# Patient Record
Sex: Male | Born: 1945
Health system: Southern US, Community
[De-identification: ages and names within clinical notes are randomized; demographics above are authoritative.]

## PROBLEM LIST (undated history)

## (undated) DIAGNOSIS — E785 Hyperlipidemia, unspecified: Secondary | ICD-10-CM

## (undated) DIAGNOSIS — I6529 Occlusion and stenosis of unspecified carotid artery: Secondary | ICD-10-CM

## (undated) DIAGNOSIS — I10 Essential (primary) hypertension: Secondary | ICD-10-CM

## (undated) DIAGNOSIS — E119 Type 2 diabetes mellitus without complications: Secondary | ICD-10-CM

## (undated) HISTORY — DX: Hyperlipidemia, unspecified: E78.5

## (undated) HISTORY — DX: Essential (primary) hypertension: I10

## (undated) HISTORY — DX: Occlusion and stenosis of unspecified carotid artery: I65.29

## (undated) HISTORY — DX: Type 2 diabetes mellitus without complications: E11.9

## (undated) HISTORY — PX: CHOLECYSTECTOMY: SHX55

---

## 2011-12-14 ENCOUNTER — Other Ambulatory Visit: Payer: Self-pay

## 2011-12-14 DIAGNOSIS — I739 Peripheral vascular disease, unspecified: Secondary | ICD-10-CM

## 2012-01-08 ENCOUNTER — Encounter: Payer: Self-pay | Admitting: Vascular Surgery

## 2012-01-08 ENCOUNTER — Other Ambulatory Visit: Payer: Self-pay | Admitting: *Deleted

## 2012-01-09 ENCOUNTER — Ambulatory Visit (INDEPENDENT_AMBULATORY_CARE_PROVIDER_SITE_OTHER): Payer: Medicare PPO | Admitting: Vascular Surgery

## 2012-01-09 ENCOUNTER — Encounter (INDEPENDENT_AMBULATORY_CARE_PROVIDER_SITE_OTHER): Payer: Medicare PPO | Admitting: *Deleted

## 2012-01-09 ENCOUNTER — Encounter: Payer: Self-pay | Admitting: Vascular Surgery

## 2012-01-09 VITALS — BP 184/63 | HR 55 | Ht 66.0 in | Wt 207.0 lb

## 2012-01-09 DIAGNOSIS — I6529 Occlusion and stenosis of unspecified carotid artery: Secondary | ICD-10-CM

## 2012-01-09 DIAGNOSIS — I739 Peripheral vascular disease, unspecified: Secondary | ICD-10-CM

## 2012-01-09 NOTE — Addendum Note (Signed)
Addended by: Dannielle Karvonen on: 01/09/2012 03:39 PM   Modules accepted: Orders

## 2012-01-09 NOTE — Assessment & Plan Note (Signed)
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 He has a palpable posterior tibial pulse on the right side not convinced that all of his symptoms can be attributed to peripheral vascular disease. He does have back pain and some of his pain may be neurogenic. 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 I have discussed with the patient the nature of atherosclerosis, and emphasized the importance of maximal medical management including control of blood pressure, blood glucose, and cholesterol levels, antiplatelet agents, obtaining regular exercise, and cessation of smoking. The patient is aware that without maximal medical management the underlying atherosclerotic disease process will progress and also limit the benefit of any interventions. I've explained that we would normally not consider revascularization unless he developed rest pain or nonhealing ulcer. I have ordered follow up ABIs for 6 months and I'll see him back at that time. If his symptoms progress on the left side, we could consider arteriography to further evaluate her for possible bypass. He knows to call sooner if he has problems or progression of his symptoms. Otherwise I'll see him in 6 months.

## 2012-01-09 NOTE — Progress Notes (Signed)
[facility]  Patient name: Barry Shepherd MRN: [id number] DOB: Mar 23, 1946 Sex: male  REASON FOR CONSULT: carotid disease and peripheral vascular disease  HPI: [name] is a 66 y.o. male a routine carotid duplex scan given his history of hypertension and family history of severe carotid disease. This showed bilateral 50-69% carotid stenoses. In addition he was complaining of leg pain and was sent for vascular evaluation. The patient states he's had history of pain in both legs for many years. He describes this as burning and stinging pain in his hips and legs bilaterally. His symptoms are more significant on the left side. He does describe some calf claudication with ambulation it is relieved with rest it is again more significant on the left side. He denies any history of rest pain or nonhealing ulcers.  He denies any history of stroke, TIAs, expressive or receptive aphasia, or amaurosis fugax. Of note he is left-handed.  He is a heavy smoker. He does not take aspirin to  Past Medical History  Diagnosis Date  . Diabetes mellitus without complication   . Carotid artery occlusion   . Hypertension   . Hyperlipidemia     Family History  Problem Relation Age of Onset  . Heart disease Father     before age 10  . Hyperlipidemia Father   . Hypertension Father   . Heart attack Father     SOCIAL HISTORY: History  Substance Use Topics  . Smoking status: Current Every Day Smoker -- 1.5 packs/day for 50 years    Types: Cigarettes  . Smokeless tobacco: Never Used  . Alcohol Use: No    No Known Allergies  Current Outpatient Prescriptions  Medication Sig Dispense Refill  . glipiZIDE (GLUCOTROL) 10 MG tablet Take 10 mg by mouth 2 (two) times daily before a meal.      . lisinopril (PRINIVIL,ZESTRIL) 40 MG tablet Take 40 mg by mouth daily.      . meloxicam (MOBIC) 15 MG tablet Take 15 mg by mouth daily.      . metFORMIN (GLUCOPHAGE) 1000 MG tablet Take  1,000 mg by mouth 2 (two) times daily with a meal.      . metoprolol succinate (TOPROL-XL) 25 MG 24 hr tablet Take 25 mg by mouth daily.      . Omega-3 Fatty Acids (FISH OIL) 1000 MG CAPS Take by mouth.      . Tamsulosin HCl (FLOMAX) 0.4 MG CAPS Take 0.4 mg by mouth daily.        REVIEW OF SYSTEMS: [name] ] denotes positive finding; [  ] denotes negative finding  CARDIOVASCULAR:  [ ]  chest pain   [ ]  chest pressure   [ ]  palpitations   [name] ] orthopnea   [name] ] dyspnea on exertion   [name] ] claudication   [ ]  rest pain   [ ]  DVT   [ ]  phlebitis PULMONARY:   [ ]  productive cough   [name] ] asthma   [ ]  wheezing NEUROLOGIC:   [ ]  weakness  [name] ] paresthesias  [ ]  aphasia  [ ]  amaurosis  [ ]  dizziness HEMATOLOGIC:   [ ]  bleeding problems   [ ]  clotting disorders MUSCULOSKELETAL:  [ ]  joint pain   [ ]  joint swelling [ ]  leg swelling GASTROINTESTINAL: [ ]   blood in stool  [ ]   hematemesis GENITOURINARY:  [name] ]  dysuria  [ ]   hematuria PSYCHIATRIC:  [ ]  history of major depression INTEGUMENTARY:  [ ]   rashes  [ ]  ulcers CONSTITUTIONAL:  [ ]  fever   [ ]  chills  PHYSICAL EXAM: Filed Vitals:   01/09/12 1105 01/09/12 1109  BP: 197/57 184/63  Pulse: 55   Height: 5\' 6"  (1.676 m)   Weight: 207 lb (93.895 kg)   SpO2: 99%    Body mass index is 33.41 kg/(m^2). GENERAL: The patient is a well-nourished male, in no acute distress. The vital signs are documented above. CARDIOVASCULAR: There is a regular rate and rhythm. I do not detect carotid bruits. He has palpable femoral pulses. On the right side, he has a palpable popliteal and posterior tibial pulse. On the left side, I cannot palpate a popliteal or pedal pulses. The left foot is warm. He has mild bilateral lower extremity swelling. PULMONARY: There is good air exchange bilaterally without wheezing or rales. ABDOMEN: Soft and non-tender with normal pitched bowel sounds. Because of his obesity, it is difficult to assess for an aneurysm. MUSCULOSKELETAL: There are no  major deformities or cyanosis. NEUROLOGIC: No focal weakness or paresthesias are detected. SKIN: There are no ulcers or rashes noted. PSYCHIATRIC: The patient has a normal affect.  DATA:  I have reviewed his carotid duplex scan which was done at the outlying hospital. He has bilateral 50-69% carotid stenoses.  I have reviewed his office records from Dr. Jinny Sanders office. He has type 2 diabetes and his hemoglobin A1c has improved from 9.6-8.2. His blood pressure has not been well controlled. His LDL was elevated on last follow up visit at 132.  I have independently interpreted her duplex scan of his legs in our office which does show some mild infrainguinal arterial occlusive disease on the right with a short segment occlusion of the left superficial femoral artery appeared  MEDICAL ISSUES:  Occlusion and stenosis of carotid artery without mention of cerebral infarction This patient has bilateral 50-69% carotid stenoses which are asymptomatic. I've explained we would not consider carotid endarterectomy unless the stenosis progressed to greater than 80% or he developed new neurologic symptoms. I have encouraged him to begin taking a baby aspirin daily as this will lower his risk of heart attack and stroke. I have ordered a follow carotid duplex scan in 6 months and I'll see him back at this time. He knows to call sooner if he has problems. We have also discussed the importance of tobacco cessation.   Peripheral vascular disease, unspecified Based on his exam and duplex study he does have evidence of infrainguinal arterial occlusive disease on the left and mild disease on the right side. He has a palpable posterior tibial pulse on the right side not convinced that all of his symptoms can be attributed to peripheral vascular disease. He does have back pain and some of his pain may be neurogenic. He does have significant infrainguinal arterial occlusive disease on the left and I have discussed with him  the importance of tobacco cessation and a structured walking program. I have discussed with the patient the nature of atherosclerosis, and emphasized the importance of maximal medical management including control of blood pressure, blood glucose, and cholesterol levels, antiplatelet agents, obtaining regular exercise, and cessation of smoking. The patient is aware that without maximal medical management the underlying atherosclerotic disease process will progress and also limit the benefit of any interventions. I've explained that we would normally not consider revascularization unless he developed rest pain or nonhealing ulcer. I have ordered follow up ABIs for 6 months and I'll see him back at that time.  If his symptoms progress on the left side, we could consider arteriography to further evaluate her for possible bypass. He knows to call sooner if he has problems or progression of his symptoms. Otherwise I'll see him in 6 months.     DICKSON,CHRISTOPHER S [facility]: [phone]

## 2012-01-09 NOTE — Assessment & Plan Note (Signed)
This patient has bilateral 50-69% carotid stenoses which are asymptomatic. I've explained we would not consider carotid endarterectomy unless the stenosis progressed to greater than 80% or he developed new neurologic symptoms. I have encouraged him to begin taking a baby aspirin daily as this will lower his risk of heart attack and stroke. I have ordered a follow carotid duplex scan in 6 months and I'll see him back at this time. He knows to call sooner if he has problems. We have also discussed the importance of tobacco cessation.

## 2012-07-08 ENCOUNTER — Encounter: Payer: Self-pay | Admitting: Vascular Surgery

## 2012-07-09 ENCOUNTER — Ambulatory Visit (INDEPENDENT_AMBULATORY_CARE_PROVIDER_SITE_OTHER): Payer: Medicare PPO | Admitting: Vascular Surgery

## 2012-07-09 ENCOUNTER — Encounter (INDEPENDENT_AMBULATORY_CARE_PROVIDER_SITE_OTHER): Payer: Medicare PPO | Admitting: *Deleted

## 2012-07-09 ENCOUNTER — Encounter: Payer: Self-pay | Admitting: Vascular Surgery

## 2012-07-09 ENCOUNTER — Other Ambulatory Visit (INDEPENDENT_AMBULATORY_CARE_PROVIDER_SITE_OTHER): Payer: Medicare PPO | Admitting: *Deleted

## 2012-07-09 DIAGNOSIS — I739 Peripheral vascular disease, unspecified: Secondary | ICD-10-CM

## 2012-07-09 DIAGNOSIS — I6529 Occlusion and stenosis of unspecified carotid artery: Secondary | ICD-10-CM

## 2012-07-09 NOTE — Assessment & Plan Note (Signed)
He has stable claudication of the left lower extremity. I've encouraged him to get off of the tobacco and get on a structured walking program. I would only consider arteriography if he developed rest pain or nonhealing ulcer unless his symptoms became disabling.I have also discussed with the patient the nature of atherosclerosis, and emphasized the importance of maximal medical management including control of blood pressure, blood glucose, and cholesterol levels, antiplatelet agents, obtaining regular exercise, and cessation of smoking. The patient is aware that without maximal medical management the underlying atherosclerotic disease process will progress and also limit the benefit of any interventions. He states that he was unable to take aspirin because he bled easily. He also states that he forgets to take his statin. Have ordered follow up ABIs and I'll see him back in one year. He knows to call sooner if he has problems.

## 2012-07-09 NOTE — Addendum Note (Signed)
Addended by: Adria Dill L on: 07/09/2012 04:03 PM   Modules accepted: Orders

## 2012-07-09 NOTE — Progress Notes (Signed)
[facility] and Vein Specialist of [facility]  Patient name: Barry Shepherd MRN: [id number] DOB: [birth year] Sex: male  REASON FOR VISIT: all of peripheral vascular disease and carotid disease.  HPI: Barry Shepherd is a 67 y.o. male comes in for a 6 month follow up visit. He has known bilateral carotid disease and also peripheral vascular disease.  With respect to his carotid disease. He denies any history of stroke, TIAs, expressive or receptive aphasia, or amaurosis fugax.  He has stable left lower extremity claudication which involves his thigh and calf on the left. He has no significant symptoms on the right. He denies rest pain or history of nonhealing ulcers.  Past Medical History  Diagnosis Date  . Diabetes mellitus without complication   . Carotid artery occlusion   . Hypertension   . Hyperlipidemia     Family History  Problem Relation Age of Onset  . Heart disease Father     before age 76  . Hyperlipidemia Father   . Hypertension Father   . Heart attack Father     SOCIAL HISTORY: History  Substance Use Topics  . Smoking status: Current Every Day Smoker -- 1.50 packs/day for 50 years    Types: Cigarettes  . Smokeless tobacco: Never Used  . Alcohol Use: No    No Known Allergies  Current Outpatient Prescriptions  Medication Sig Dispense Refill  . glipiZIDE (GLUCOTROL) 10 MG tablet Take 10 mg by mouth 2 (two) times daily before a meal.      . lisinopril (PRINIVIL,ZESTRIL) 40 MG tablet Take 40 mg by mouth daily.      . meloxicam (MOBIC) 15 MG tablet Take 15 mg by mouth daily.      . metFORMIN (GLUCOPHAGE) 1000 MG tablet Take 1,000 mg by mouth 2 (two) times daily with a meal.      . metoprolol succinate (TOPROL-XL) 25 MG 24 hr tablet Take 25 mg by mouth daily.      . Omega-3 Fatty Acids (FISH OIL) 1000 MG CAPS Take by mouth.      . Tamsulosin HCl (FLOMAX) 0.4 MG CAPS Take 0.4 mg by mouth daily.       No current facility-administered medications for this visit.     REVIEW OF SYSTEMS: [name] ] denotes positive finding; [  ] denotes negative finding  CARDIOVASCULAR:  [ ]  chest pain   [ ]  chest pressure   [ ]  palpitations   [ ]  orthopnea   [name] ] dyspnea on exertion   [ ]  claudication   [ ]  rest pain   [ ]  DVT   [ ]  phlebitis PULMONARY:   [ ]  productive cough   [ ]  asthma   [ ]  wheezing NEUROLOGIC:   [ ]  weakness  [ ]  paresthesias  [ ]  aphasia  [ ]  amaurosis  [ ]  dizziness HEMATOLOGIC:   [name] ] bleeding problems   [ ]  clotting disorders MUSCULOSKELETAL:  [ ]  joint pain   [ ]  joint swelling [ ]  leg swelling GASTROINTESTINAL: [ ]   blood in stool  [ ]   hematemesis GENITOURINARY:  [ ]   dysuria  [ ]   hematuria PSYCHIATRIC:  [ ]  history of major depression INTEGUMENTARY:  [ ]  rashes  [ ]  ulcers CONSTITUTIONAL:  [ ]  fever   [ ]  chills  PHYSICAL EXAM: Filed Vitals:   07/09/12 1011 07/09/12 1015  BP: 147/64 151/68  Pulse: 69 70  Resp: 16   Height: 5\' 6"  (1.676 m)   Weight: 201 lb (  91.173 kg)   SpO2: 98%    Body mass index is 32.46 kg/(m^2). GENERAL: The patient is a well-nourished male, in no acute distress. The vital signs are documented above. CARDIOVASCULAR: There is a regular rate and rhythm. He has a right carotid bruit. He has palpable femoral pulses although the left femoral pulse maybe slightly diminished. I cannot palpate popliteal pulses bilaterally. He does have a weakly palpable right posterior tibial pulse. I cannot palpate pedal pulses on the left. PULMONARY: There is good air exchange bilaterally without wheezing or rales. ABDOMEN: Soft and non-tender with normal pitched bowel sounds. His abdomen is obese and difficult to assess for an aneurysm. MUSCULOSKELETAL: There are no major deformities or cyanosis. NEUROLOGIC: No focal weakness or paresthesias are detected. SKIN: There are no ulcers or rashes noted. PSYCHIATRIC: The patient has a normal affect.  DATA:  I have independently interpreted his carotid duplex scan which shows bilateral  40-59% carotid stenoses.  I have independently interpreted his arterial Doppler study which is biphasic Doppler signals in the right posterior tibial and dorsalis pedis positions with an ABI of 100%. On the left side he has a monophasic posterior tibial signal and a monophasic dorsalis pedis signal. ABI on the left is 83%. He is actually improved slightly compared to the study 6 months ago.  MEDICAL ISSUES:  Peripheral vascular disease, unspecified He has stable claudication of the left lower extremity. I've encouraged him to get off of the tobacco and get on a structured walking program. I would only consider arteriography if he developed rest pain or nonhealing ulcer unless his symptoms became disabling.I have also discussed with the patient the nature of atherosclerosis, and emphasized the importance of maximal medical management including control of blood pressure, blood glucose, and cholesterol levels, antiplatelet agents, obtaining regular exercise, and cessation of smoking. The patient is aware that without maximal medical management the underlying atherosclerotic disease process will progress and also limit the benefit of any interventions. He states that he was unable to take aspirin because he bled easily. He also states that he forgets to take his statin. Have ordered follow up ABIs and I'll see him back in one year. He knows to call sooner if he has problems.   Occlusion and stenosis of carotid artery without mention of cerebral infarction He has bilateral asymptomatic 40-59% carotid stenoses. He understands we would not consider carotid endarterectomy was the stenosis progressed to greater than 80%. Discussed the importance of tobacco cessation and also I've encouraged him to get on an aspirin daily. He does not take [place] this he states he bleeds easily. I've asked him to consider at least 81 mg a day or even every other day. I've ordered a follow carotid duplex scan in 1 year and I'll see him  back at that time. He knows to call sooner if he has problems.   Mollie Rossano S [facility] [place]: [phone]

## 2012-07-09 NOTE — Assessment & Plan Note (Signed)
He has bilateral asymptomatic 40-59% carotid stenoses. He understands we would not consider carotid endarterectomy was the stenosis progressed to greater than 80%. Discussed the importance of tobacco cessation and also I've encouraged him to get on an aspirin daily. He does not take [place] this he states he bleeds easily. I've asked him to consider at least 81 mg a day or even every other day. I've ordered a follow carotid duplex scan in 1 year and I'll see him back at that time. He knows to call sooner if he has problems.

## 2013-04-29 ENCOUNTER — Other Ambulatory Visit: Payer: Self-pay | Admitting: Vascular Surgery

## 2013-04-29 DIAGNOSIS — I6529 Occlusion and stenosis of unspecified carotid artery: Secondary | ICD-10-CM

## 2013-04-29 DIAGNOSIS — I739 Peripheral vascular disease, unspecified: Secondary | ICD-10-CM

## 2013-07-08 ENCOUNTER — Other Ambulatory Visit (HOSPITAL_COMMUNITY): Payer: Medicare PPO

## 2013-07-08 ENCOUNTER — Ambulatory Visit: Payer: Medicare PPO | Admitting: Vascular Surgery

## 2013-07-14 ENCOUNTER — Encounter: Payer: Self-pay | Admitting: Family

## 2013-07-15 ENCOUNTER — Encounter: Payer: Self-pay | Admitting: Family

## 2013-07-15 ENCOUNTER — Ambulatory Visit (HOSPITAL_COMMUNITY)
Admission: RE | Admit: 2013-07-15 | Discharge: 2013-07-15 | Disposition: A | Discharge status: Home / Self Care | Payer: Medicare PPO | Source: Ambulatory Visit | Attending: Family | Admitting: Family

## 2013-07-15 ENCOUNTER — Ambulatory Visit (INDEPENDENT_AMBULATORY_CARE_PROVIDER_SITE_OTHER): Payer: Medicare PPO | Admitting: Family

## 2013-07-15 VITALS — BP 172/75 | HR 70 | Resp 16 | Ht 65.0 in | Wt 204.0 lb

## 2013-07-15 DIAGNOSIS — I739 Peripheral vascular disease, unspecified: Secondary | ICD-10-CM

## 2013-07-15 DIAGNOSIS — I6529 Occlusion and stenosis of unspecified carotid artery: Secondary | ICD-10-CM

## 2013-07-15 NOTE — Patient Instructions (Signed)
Stroke Prevention Some medical conditions and behaviors are associated with an increased chance of having a stroke. You may prevent a stroke by making healthy choices and managing medical conditions. HOW CAN I REDUCE MY RISK OF HAVING A STROKE?   Stay physically active. Get at least 30 minutes of activity on most or all days.  Do not smoke. It may also be helpful to avoid exposure to secondhand smoke.  Limit alcohol use. Moderate alcohol use is considered to be:  No more than 2 drinks per day for men.  No more than 1 drink per day for nonpregnant women.  Eat healthy foods. This involves  Eating 5 or more servings of fruits and vegetables a day.  Following a diet that addresses high blood pressure (hypertension), high cholesterol, diabetes, or obesity.  Manage your cholesterol levels.  A diet low in saturated fat, trans fat, and cholesterol and high in fiber may control cholesterol levels.  Take any prescribed medicines to control cholesterol as directed by your health care provider.  Manage your diabetes.  A controlled-carbohydrate, controlled-sugar diet is recommended to manage diabetes.  Take any prescribed medicines to control diabetes as directed by your health care provider.  Control your hypertension.  A low-salt (sodium), low-saturated fat, low-trans fat, and low-cholesterol diet is recommended to manage hypertension.  Take any prescribed medicines to control hypertension as directed by your health care provider.  Maintain a healthy weight.  A reduced-calorie, low-sodium, low-saturated fat, low-trans fat, low-cholesterol diet is recommended to manage weight.  Stop drug abuse.  Avoid taking birth control pills.  Talk to your health care provider about the risks of taking birth control pills if you are over 35 years old, smoke, get migraines, or have ever had a blood clot.  Get evaluated for sleep disorders (sleep apnea).  Talk to your health care provider about  getting a sleep evaluation if you snore a lot or have excessive sleepiness.  Take medicines as directed by your health care provider.  For some people, aspirin or blood thinners (anticoagulants) are helpful in reducing the risk of forming abnormal blood clots that can lead to stroke. If you have the irregular heart rhythm of atrial fibrillation, you should be on a blood thinner unless there is a good reason you cannot take them.  Understand all your medicine instructions.  Make sure that other other conditions (such as anemia or atherosclerosis) are addressed. SEEK IMMEDIATE MEDICAL CARE IF:   You have sudden weakness or numbness of the face, arm, or leg, especially on one side of the body.  Your face or eyelid droops to one side.  You have sudden confusion.  You have trouble speaking (aphasia) or understanding.  You have sudden trouble seeing in one or both eyes.  You have sudden trouble walking.  You have dizziness.  You have a loss of balance or coordination.  You have a sudden, severe headache with no known cause.  You have new chest pain or an irregular heartbeat. Any of these symptoms may represent a serious problem that is an emergency. Do not wait to see if the symptoms will go away. Get medical help at once. Call your local emergency services  (911 in [place] Do not drive yourself to the hospital. Document Released: 04/19/2004 Document Revised: 12/31/2012 Document Reviewed: 09/12/2012 [employer] Patient Information 2014 [employer].   Peripheral Vascular Disease Peripheral Vascular Disease (PVD), also called Peripheral Arterial Disease (PAD), is a circulation problem caused by cholesterol (atherosclerotic plaque) deposits in the   arteries. PVD commonly occurs in the lower extremities (legs) but it can occur in other areas of the body, such as your arms. The cholesterol buildup in the arteries reduces blood flow which can cause pain and other serious problems. The presence  of PVD can place a person at risk for Coronary Artery Disease (CAD).  CAUSES  Causes of PVD can be many. It is usually associated with more than one risk factor such as:   High Cholesterol.  Smoking.  Diabetes.  Lack of exercise or inactivity.  High blood pressure (hypertension).  Obesity.  Family history. SYMPTOMS   When the lower extremities are affected, patients with PVD may experience:  Leg pain with exertion or physical activity. This is called INTERMITTENT CLAUDICATION. This may present as cramping or numbness with physical activity. The location of the pain is associated with the level of blockage. For example, blockage at the abdominal level (distal abdominal aorta) may result in buttock or hip pain. Lower leg arterial blockage may result in calf pain.  As PVD becomes more severe, pain can develop with less physical activity.  In people with severe PVD, leg pain may occur at rest.  Other PVD signs and symptoms:  Leg numbness or weakness.  Coldness in the affected leg or foot, especially when compared to the other leg.  A change in leg color.  Patients with significant PVD are more prone to ulcers or sores on toes, feet or legs. These may take longer to heal or may reoccur. The ulcers or sores can become infected.  If signs and symptoms of PVD are ignored, gangrene may occur. This can result in the loss of toes or loss of an entire limb.  Not all leg pain is related to PVD. Other medical conditions can cause leg pain such as:  Blood clots (embolism) or Deep Vein Thrombosis.  Inflammation of the blood vessels (vasculitis).  Spinal stenosis. DIAGNOSIS  Diagnosis of PVD can involve several different types of tests. These can include:  Pulse Volume Recording Method (PVR). This test is simple, painless and does not involve the use of X-rays. PVR involves measuring and comparing the blood pressure in the arms and legs. An ABI (Ankle-Brachial Index) is calculated.  The normal ratio of blood pressures is 1. As this number becomes smaller, it indicates more severe disease.  < 0.95  indicates significant narrowing in one or more leg vessels.  <0.8 there will usually be pain in the foot, leg or buttock with exercise.  <0.4 will usually have pain in the legs at rest.  <0.25  usually indicates limb threatening PVD.  Doppler detection of pulses in the legs. This test is painless and checks to see if you have a pulses in your legs/feet.  A dye or contrast material (a substance that highlights the blood vessels so they show up on x-ray) may be given to help your caregiver better see the arteries for the following tests. The dye is eliminated from your body by the kidney's. Your caregiver may order blood work to check your kidney function and other laboratory values before the following tests are performed:  Magnetic Resonance Angiography (MRA). An MRA is a picture study of the blood vessels and arteries. The MRA machine uses a large magnet to produce images of the blood vessels.  Computed Tomography Angiography (CTA). A CTA is a specialized x-ray that looks at how the blood flows in your blood vessels. An IV may be inserted into your arm so contrast dye   can be injected.  Angiogram. Is a procedure that uses x-rays to look at your blood vessels. This procedure is minimally invasive, meaning a small incision (cut) is made in your groin. A small tube (catheter) is then inserted into the artery of your groin. The catheter is guided to the blood vessel or artery your caregiver wants to examine. Contrast dye is injected into the catheter. X-rays are then taken of the blood vessel or artery. After the images are obtained, the catheter is taken out. TREATMENT  Treatment of PVD involves many interventions which may include:  Lifestyle changes:  Quitting smoking.  Exercise.  Following a low fat, low cholesterol diet.  Control of diabetes.  Foot care is very  important to the PVD patient. Good foot care can help prevent infection.  Medication:  Cholesterol-lowering medicine.  Blood pressure medicine.  Anti-platelet drugs.  Certain medicines may reduce symptoms of Intermittent Claudication.  Interventional/Surgical options:  Angioplasty. An Angioplasty is a procedure that inflates a balloon in the blocked artery. This opens the blocked artery to improve blood flow.  Stent Implant. A wire mesh tube (stent) is placed in the artery. The stent expands and stays in place, allowing the artery to remain open.  Peripheral Bypass Surgery. This is a surgical procedure that reroutes the blood around a blocked artery to help improve blood flow. This type of procedure may be performed if Angioplasty or stent implants are not an option. SEEK IMMEDIATE MEDICAL CARE IF:   You develop pain or numbness in your arms or legs.  Your arm or leg turns cold, becomes blue in color.  You develop redness, warmth, swelling and pain in your arms or legs. MAKE SURE YOU:   Understand these instructions.  Will watch your condition.  Will get help right away if you are not doing well or get worse. Document Released: 04/19/2004 Document Revised: 06/04/2011 Document Reviewed: 03/16/2008 Exit[employer] Patient Information 2014 [employer].   Smoking Cessation Quitting smoking is important to your health and has many advantages. However, it is not always easy to quit since nicotine is a very addictive drug. Often times, people try 3 times or more before being able to quit. This document explains the best ways for you to prepare to quit smoking. Quitting takes hard work and a lot of effort, but you can do it. ADVANTAGES OF QUITTING SMOKING  You will live longer, feel better, and live better.  Your body will feel the impact of quitting smoking almost immediately.  Within 20 minutes, blood pressure decreases. Your pulse returns to its normal level.  After 8 hours,  carbon monoxide levels in the blood return to normal. Your oxygen level increases.  After 24 hours, the chance of having a heart attack starts to decrease. Your breath, hair, and body stop smelling like smoke.  After 48 hours, damaged nerve endings begin to recover. Your sense of taste and smell improve.  After 72 hours, the body is virtually free of nicotine. Your bronchial tubes relax and breathing becomes easier.  After 2 to 12 weeks, lungs can hold more air. Exercise becomes easier and circulation improves.  The risk of having a heart attack, stroke, cancer, or lung disease is greatly reduced.  After 1 year, the risk of coronary heart disease is cut in half.  After 5 years, the risk of stroke falls to the same as a nonsmoker.  After 10 years, the risk of lung cancer is cut in half and the risk of other cancers   decreases significantly.  After 15 years, the risk of coronary heart disease drops, usually to the level of a nonsmoker.  If you are pregnant, quitting smoking will improve your chances of having a healthy baby.  The people you live with, especially any children, will be healthier.  You will have extra money to spend on things other than cigarettes. QUESTIONS TO THINK ABOUT BEFORE ATTEMPTING TO QUIT You may want to talk about your answers with your caregiver.  Why do you want to quit?  If you tried to quit in the past, what helped and what did not?  What will be the most difficult situations for you after you quit? How will you plan to handle them?  Who can help you through the tough times? Your family? Friends? A caregiver?  What pleasures do you get from smoking? What ways can you still get pleasure if you quit? Here are some questions to ask your caregiver:  How can you help me to be successful at quitting?  What medicine do you think would be best for me and how should I take it?  What should I do if I need more help?  What is smoking withdrawal like? How  can I get information on withdrawal? GET READY  Set a quit date.  Change your environment by getting rid of all cigarettes, ashtrays, matches, and lighters in your home, car, or work. Do not let people smoke in your home.  Review your past attempts to quit. Think about what worked and what did not. GET SUPPORT AND ENCOURAGEMENT You have a better chance of being successful if you have help. You can get support in many ways.  Tell your family, friends, and co-workers that you are going to quit and need their support. Ask them not to smoke around you.  Get individual, group, or telephone counseling and support. Programs are available at local hospitals and health centers. Call your local health department for information about programs in your area.  Spiritual beliefs and practices may help some smokers quit.  Download a "quit meter" on your computer to keep track of quit statistics, such as how long you have gone without smoking, cigarettes not smoked, and money saved.  Get a self-help book about quitting smoking and staying off of tobacco. LEARN NEW SKILLS AND BEHAVIORS  Distract yourself from urges to smoke. Talk to someone, go for a walk, or occupy your time with a task.  Change your normal routine. Take a different route to work. Drink tea instead of coffee. Eat breakfast in a different place.  Reduce your stress. Take a hot bath, exercise, or read a book.  Plan something enjoyable to do every day. Reward yourself for not smoking.  Explore interactive web-based programs that specialize in helping you quit. GET MEDICINE AND USE IT CORRECTLY Medicines can help you stop smoking and decrease the urge to smoke. Combining medicine with the above behavioral methods and support can greatly increase your chances of successfully quitting smoking.  Nicotine replacement therapy helps deliver nicotine to your body without the negative effects and risks of smoking. Nicotine replacement therapy  includes nicotine gum, lozenges, inhalers, nasal sprays, and skin patches. Some may be available over-the-counter and others require a prescription.  Antidepressant medicine helps people abstain from smoking, but how this works is unknown. This medicine is available by prescription.  Nicotinic receptor partial agonist medicine simulates the effect of nicotine in your brain. This medicine is available by prescription. Ask your caregiver for   advice about which medicines to use and how to use them based on your health history. Your caregiver will tell you what side effects to look out for if you choose to be on a medicine or therapy. Carefully read the information on the package. Do not use any other product containing nicotine while using a nicotine replacement product.  RELAPSE OR DIFFICULT SITUATIONS Most relapses occur within the first 3 months after quitting. Do not be discouraged if you start smoking again. Remember, most people try several times before finally quitting. You may have symptoms of withdrawal because your body is used to nicotine. You may crave cigarettes, be irritable, feel very hungry, cough often, get headaches, or have difficulty concentrating. The withdrawal symptoms are only temporary. They are strongest when you first quit, but they will go away within 10 14 days. To reduce the chances of relapse, try to:  Avoid drinking alcohol. Drinking lowers your chances of successfully quitting.  Reduce the amount of caffeine you consume. Once you quit smoking, the amount of caffeine in your body increases and can give you symptoms, such as a rapid heartbeat, sweating, and anxiety.  Avoid smokers because they can make you want to smoke.  Do not let weight gain distract you. Many smokers will gain weight when they quit, usually less than 10 pounds. Eat a healthy diet and stay active. You can always lose the weight gained after you quit.  Find ways to improve your mood other than  smoking. FOR MORE INFORMATION  www.smokefree.gov  Document Released: 03/06/2001 Document Revised: 09/11/2011 Document Reviewed: 06/21/2011 [employer] Patient Information 2014 [employer].  

## 2013-07-15 NOTE — Addendum Note (Signed)
Addended by: Sharee PimpleMCCHESNEY, MARILYN K on: 07/15/2013 06:21 PM   Modules accepted: Orders

## 2013-07-15 NOTE — Progress Notes (Signed)
Established Carotid Patient   History of Present Illness  Barry HaberBilly Shore is a 68 y.o. male patient of Dr. Edilia Boickson who returns today for carotid artery surveillance. He also has known PAD. He has known bilateral carotid disease and also peripheral vascular disease.  With respect to his carotid disease. He denies any history of stroke, TIAs, expressive or receptive aphasia, or amaurosis fugax.  He has stable left lower extremity claudication which involves his thigh and calf on the left. He has no significant symptoms on the right. He denies rest pain or history of nonhealing ulcers. He reports pain in the buttocks after walking about 100-200 feet, relieved with rest, states this is improving over time, relieved with rest; but he states that he has been diagnosed with bilateral hip arthritis and lumbar spine issues.  Patient has not had previous carotid artery intervention.  Patient has Negative history of TIA or stroke symptom.  The patient denies amaurosis fugax or monocular blindness.  The patient  denies facial drooping.  Pt. denies hemiplegia.  The patient denies receptive or expressive aphasia.     Patient denies New Medical or Surgical History.  Pt Diabetic: Yes Pt smoker: smoker  (1.5 ppd, started at age 68 yrs)  Pt meds include: Statin : No ASA: Yes Other anticoagulants/antiplatelets: no   Past Medical History  Diagnosis Date  . Diabetes mellitus without complication   . Carotid artery occlusion   . Hypertension   . Hyperlipidemia     Social History History  Substance Use Topics  . Smoking status: Current Every Day Smoker -- 1.50 packs/day for 50 years    Types: Cigarettes  . Smokeless tobacco: Never Used  . Alcohol Use: No    Family History Family History  Problem Relation Age of Onset  . Heart disease Father     before age 68  . Hyperlipidemia Father   . Hypertension Father   . Heart attack Father   . Heart disease Mother   . Hypertension Mother      Surgical History Past Surgical History  Procedure Laterality Date  . Cholecystectomy      No Known Allergies  Current Outpatient Prescriptions  Medication Sig Dispense Refill  . ACCU-CHEK AVIVA PLUS test strip       . ACCU-CHEK SOFTCLIX LANCETS lancets       . glipiZIDE (GLUCOTROL) 10 MG tablet Take 10 mg by mouth 2 (two) times daily before a meal.      . lisinopril (PRINIVIL,ZESTRIL) 40 MG tablet Take 40 mg by mouth daily.      . meloxicam (MOBIC) 15 MG tablet Take 15 mg by mouth daily.      . metFORMIN (GLUCOPHAGE) 1000 MG tablet Take 1,000 mg by mouth 2 (two) times daily with a meal.      . metoprolol succinate (TOPROL-XL) 25 MG 24 hr tablet Take 25 mg by mouth daily.      . Omega-3 Fatty Acids (FISH OIL) 1000 MG CAPS Take by mouth.      . Tamsulosin HCl (FLOMAX) 0.4 MG CAPS Take 0.4 mg by mouth daily.       No current facility-administered medications for this visit.    Review of Systems : See HPI for pertinent positives and negatives.  Physical Examination  Filed Vitals:   07/15/13 1533  BP: 172/75  Pulse: 70  Resp: 16   Filed Weights   07/15/13 1533  Weight: 204 lb (92.534 kg)   Body mass index is 33.95 kg/(m^2).  General: WDWN male in NAD GAIT: normal Eyes: PERRLA Pulmonary:  Non-labored, CTAB, Negative  Rales, Negative rhonchi, & Negative wheezing.  Cardiac: regular Rhythm ,  Negative detected murmur.  VASCULAR EXAM Carotid Bruits Left Right   Negative Positive    Aorta is not palpable. Radial pulses are 2+ palpable and equal.                                                                                                                            LE Pulses LEFT RIGHT       FEMORAL  1+ palpable  2+ palpable        POPLITEAL  not palpable   not palpable       POSTERIOR TIBIAL  not palpable    palpable        DORSALIS PEDIS      ANTERIOR TIBIAL faintly palpable   palpable     Gastrointestinal: soft, nontender, BS WNL, no r/g,  negative  masses.  Musculoskeletal: Negative muscle atrophy/wasting. M/S 5/5 throughout, Extremities without ischemic changes  Neurologic: A&O X 3; Appropriate Affect ; SENSATION ;normal;  Speech is normal CN 2-12 intact, Pain and light touch intact in extremities, Motor exam as listed above.   Non-Invasive Vascular Imaging CAROTID DUPLEX 07/15/2013   CEREBROVASCULAR DUPLEX EVALUATION    INDICATION: Carotid disease    PREVIOUS INTERVENTION(S):     DUPLEX EXAM:     RIGHT  LEFT  Peak Systolic Velocities (cm/s) End Diastolic Velocities (cm/s) Plaque LOCATION Peak Systolic Velocities (cm/s) End Diastolic Velocities (cm/s) Plaque  128 20  CCA PROXIMAL 112 14   90 16  CCA MID 117 19   78 17  CCA DISTAL 86 14 CP  171 13 HT ECA 117 13   265 71 HT ICA PROXIMAL 223 68 CP  83 14  ICA MID 133 68   60 20  ICA DISTAL 107 21     3.4 ICA / CCA Ratio (PSV) 2.6  Antegrade Vertebral Flow Antegrade   Brachial Systolic Pressure (mmHg)   Multiphasic (subclavian artery) Brachial Artery Waveforms Multiphasic (subclavian artery)    Plaque Morphology:  HM = Homogeneous, HT = Heterogeneous, CP = Calcific Plaque, SP = Smooth Plaque, IP = Irregular Plaque     ADDITIONAL FINDINGS: No significant stenosis of the bilateral external or common carotid arteries.    IMPRESSION: Doppler velocities suggest a 60-79% stenosis of the right proximal-mid internal carotid artery and a 60-79% stenosis of the left proximal internal carotid artery.    Compared to the previous exam:  Significant progression of disease noted in the bilateral internal carotid arteries when compared to the previous exam on 07/09/12.      Assessment: Barry Shepherd is a 68 y.o. 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 The bilateral  ICA stenosis is  Worse from previous exam.  Plan:  He was counseled re smoking cessation. Follow-up in 6 months with  Carotid Duplex scan and ABI's.   I discussed in depth with the  patient the nature of atherosclerosis, and emphasized the importance of maximal medical management including strict control of blood pressure, blood glucose, and lipid levels, obtaining regular exercise, and cessation of smoking.  The patient is aware that without maximal medical management the underlying atherosclerotic disease process will progress, limiting the benefit of any interventions. The patient was given information about stroke prevention and what symptoms should prompt the patient to seek immediate medical care. Thank you for allowing [place] to participate in this patient's care.  Charisse March, RN, MSN, FNP-C [facility] Office: [phone]  Clinic Physician: Edilia Bo  07/15/2013 3:47 PM

## 2013-12-24 HISTORY — PX: CARDIOVASCULAR STRESS TEST: SHX262

## 2014-01-12 ENCOUNTER — Ambulatory Visit (INDEPENDENT_AMBULATORY_CARE_PROVIDER_SITE_OTHER)
Admission: RE | Admit: 2014-01-12 | Discharge: 2014-01-12 | Disposition: A | Discharge status: Home / Self Care | Payer: Medicare PPO | Source: Ambulatory Visit | Attending: Vascular Surgery | Admitting: Vascular Surgery

## 2014-01-12 ENCOUNTER — Ambulatory Visit (HOSPITAL_COMMUNITY)
Admission: RE | Admit: 2014-01-12 | Discharge: 2014-01-12 | Disposition: A | Discharge status: Home / Self Care | Payer: Medicare PPO | Source: Ambulatory Visit | Attending: Vascular Surgery | Admitting: Vascular Surgery

## 2014-01-12 DIAGNOSIS — I6523 Occlusion and stenosis of bilateral carotid arteries: Secondary | ICD-10-CM | POA: Insufficient documentation

## 2014-01-12 DIAGNOSIS — I739 Peripheral vascular disease, unspecified: Secondary | ICD-10-CM

## 2014-01-19 ENCOUNTER — Encounter: Payer: Self-pay | Admitting: Family

## 2014-01-20 ENCOUNTER — Ambulatory Visit: Payer: Medicare PPO | Admitting: Family

## 2014-01-26 ENCOUNTER — Encounter: Payer: Self-pay | Admitting: Family

## 2014-01-27 ENCOUNTER — Ambulatory Visit (INDEPENDENT_AMBULATORY_CARE_PROVIDER_SITE_OTHER): Payer: Medicare PPO | Admitting: Family

## 2014-01-27 ENCOUNTER — Encounter: Payer: Self-pay | Admitting: Family

## 2014-01-27 VITALS — BP 175/76 | HR 66 | Resp 16 | Ht 66.0 in | Wt 207.0 lb

## 2014-01-27 DIAGNOSIS — I6529 Occlusion and stenosis of unspecified carotid artery: Secondary | ICD-10-CM | POA: Insufficient documentation

## 2014-01-27 DIAGNOSIS — I7090 Unspecified atherosclerosis: Secondary | ICD-10-CM

## 2014-01-27 DIAGNOSIS — I6523 Occlusion and stenosis of bilateral carotid arteries: Secondary | ICD-10-CM

## 2014-01-27 NOTE — Progress Notes (Signed)
[facility] [place] HISTORY AND PHYSICAL   MRN : [id number]  History of Present Illness:   Barry Shepherd is a 68 y.o. male patient of Dr. Edilia Boickson who returns today for follow up of known bilateral carotid disease and peripheral vascular disease.   With respect to his carotid disease. He denies any history of stroke, TIAs, expressive or receptive aphasia, or amaurosis fugax.     He denies rest pain or history of nonhealing ulcers. He reports he had pain in the buttocks after walking about 100-200 feet, relieved with rest, states this is improving over time, relieved with rest; but he states that he has been diagnosed with bilateral hip arthritis and lumbar spine issues. He finds now that since he decreased the dose of glipizide, and his bilateral hip pain with walking is significantly less, he denies claudication symptoms in thighs of calves.  Patient has not had previous carotid artery intervention.  Patient reports New Medical or Surgical History: had a stress test October, 2015, states result was fine.  Pt Diabetic: Yes, states his last A1C was 8.1 Pt smoker: smoker (1.5 ppd, started at age 68 yrs)  Pt meds include: Statin : No ASA: Yes Other anticoagulants/antiplatelets: no   Current Outpatient Prescriptions  Medication Sig Dispense Refill  . ACCU-CHEK AVIVA PLUS test strip     . ACCU-CHEK SOFTCLIX LANCETS lancets     . glipiZIDE (GLUCOTROL) 10 MG tablet Take 10 mg by mouth 2 (two) times daily before a meal.    . lisinopril (PRINIVIL,ZESTRIL) 40 MG tablet Take 40 mg by mouth daily.    . meloxicam (MOBIC) 15 MG tablet Take 15 mg by mouth daily.    . metFORMIN (GLUCOPHAGE) 1000 MG tablet Take 1,000 mg by mouth 2 (two) times daily with a meal.    . metoprolol succinate (TOPROL-XL) 25 MG 24 hr tablet Take 25 mg by mouth daily.    . Omega-3 Fatty Acids (FISH OIL) 1000 MG CAPS Take by mouth.    . Tamsulosin HCl (FLOMAX) 0.4 MG CAPS Take 0.4 mg by mouth  daily.     No current facility-administered medications for this visit.    Past Medical History  Diagnosis Date  . Diabetes mellitus without complication   . Carotid artery occlusion   . Hypertension   . Hyperlipidemia     Social History History  Substance Use Topics  . Smoking status: Current Every Day Smoker -- 1.50 packs/day for 50 years    Types: Cigarettes  . Smokeless tobacco: Never Used  . Alcohol Use: No    Family History Family History  Problem Relation Age of Onset  . Heart disease Father     before age 68  . Hyperlipidemia Father   . Hypertension Father   . Heart attack Father   . Heart disease Mother   . Hypertension Mother     Surgical History Past Surgical History  Procedure Laterality Date  . Cholecystectomy      No Known Allergies  Current Outpatient Prescriptions  Medication Sig Dispense Refill  . ACCU-CHEK AVIVA PLUS test strip     . ACCU-CHEK SOFTCLIX LANCETS lancets     . glipiZIDE (GLUCOTROL) 10 MG tablet Take 10 mg by mouth 2 (two) times daily before a meal.    . lisinopril (PRINIVIL,ZESTRIL) 40 MG tablet Take 40 mg by mouth daily.    . meloxicam (MOBIC) 15 MG tablet Take 15 mg by mouth daily.    . metFORMIN (GLUCOPHAGE) 1000  MG tablet Take 1,000 mg by mouth 2 (two) times daily with a meal.    . metoprolol succinate (TOPROL-XL) 25 MG 24 hr tablet Take 25 mg by mouth daily.    . Omega-3 Fatty Acids (FISH OIL) 1000 MG CAPS Take by mouth.    . Tamsulosin HCl (FLOMAX) 0.4 MG CAPS Take 0.4 mg by mouth daily.     No current facility-administered medications for this visit.     REVIEW OF SYSTEMS: See HPI for pertinent positives and negatives.  Physical Examination  Filed Vitals:   01/27/14 0949 01/27/14 0952  BP: 178/76 175/76  Pulse: 65 66  Resp:  16  Height:  5\' 6"  (1.676 m)  Weight:  207 lb (93.895 kg)  SpO2:  99%   Body mass index is 33.43 kg/(m^2).  General: WDWN obese male in NAD GAIT: normal Eyes: PERRLA Pulmonary:  Non-labored, CTAB, Negative Rales, Negative rhonchi, & Negative wheezing.  Cardiac: regular Rhythm, positive murmur.  VASCULAR EXAM Carotid Bruits Left Right   Negative Transmitted cardiac murmur   Aorta is not palpable. Radial pulses are 2+ palpable and equal.      LE Pulses LEFT RIGHT   FEMORAL 1+ palpable 2+ palpable    POPLITEAL not palpable  not palpable   POSTERIOR TIBIAL not palpable   2+palpable    DORSALIS PEDIS  ANTERIOR TIBIAL not palpable  not palpable     Gastrointestinal: soft, nontender, BS WNL, no r/g, no palpated masses.  Musculoskeletal: Negative muscle atrophy/wasting. M/S 5/5 throughout, Extremities without ischemic changes  Neurologic: A&O X 3; Appropriate Affect ; SENSATION ;normal;  Speech is normal CN 2-12 intact, Pain and light touch intact in extremities, Motor exam as listed above.    Non-Invasive Vascular Imaging (01/27/2014): CEREBROVASCULAR DUPLEX EVALUATION    INDICATION: Carotid artery disease    PREVIOUS INTERVENTION(S):     DUPLEX EXAM: Carotid duplex    RIGHT  LEFT  Peak Systolic Velocities (cm/s) End Diastolic Velocities (cm/s) Plaque LOCATION Peak Systolic Velocities (cm/s) End Diastolic Velocities (cm/s) Plaque  116 13 - CCA PROXIMAL 114 14 -  68 11 - CCA MID 91 11 -  40 8 - CCA DISTAL 98 21 CP  120 9 HT ECA 88 10 -  334 100 HT ICA PROXIMAL 223 51 CP  126 21 - ICA MID 198 46 -  102 24 - ICA DISTAL 118 34 -    4.91 ICA / CCA Ratio (PSV) 2.4  Antegrade Vertebral Flow Antegrade  181 Brachial Systolic Pressure (mmHg) 186  Triphasic Brachial Artery Waveforms Triphasic    Plaque Morphology:  HM = Homogeneous, HT = Heterogeneous, CP = Calcific Plaque, SP = Smooth Plaque, IP = Irregular Plaque     ADDITIONAL FINDINGS:     IMPRESSION: 1. Technically  difficult exam due to patient's inability to stop snoring and body habitus. 2. Bilateral 60 - 79% internal carotid artery stenosis.    Compared to the previous exam:  No significant change    ABI (Date: 01/12/14)  R: 0.93 (07/09/12, 1.10),waveforms: biphasic, TBI: 0.65  L: 0.93 (0.83), waveforms: monophasic, TBI: 0.55     ASSESSMENT:  Barry Shepherd is a 68 y.o. male who has stable asymptomatic moderate to severe bilateral ICA stenosis and stable ABI's, claudication symptoms in both hips seem to have improved significantly with glipizide dose reduction. However, his DM is uncontrolled, he smokes 1.5 ppd, and is obese with central obesity. He was snoring during the carotid Duplex, suspect obstructive sleep apnea. He  has several significant atherosclerotic risk factors. ABI's indicate evidence of mild arterial occlusive disease in the right leg and moderaet to severe in the left, likely has calcified arteries in the left leg with the 0.93 ABI yet has monophasic waveforms in the left leg.  PLAN:   The patient was counseled re smoking cessation and given several free resources re smoking cessation. Graduated walking program.  Based on today's exam and non-invasive vascular lab results, the patient will follow up in 6 months with the following tests carotid Duplex and ABI's. I discussed in depth with the patient the nature of atherosclerosis, and emphasized the importance of maximal medical management including strict control of blood pressure, blood glucose, and lipid levels, obtaining regular exercise, and cessation of smoking.  The patient is aware that without maximal medical management the underlying atherosclerotic disease process will progress, limiting the benefit of any interventions.  The patient was given information about stroke prevention and what symptoms should prompt the patient to seek immediate medical care.  The patient was given information about PAD including signs,  symptoms, treatment, what symptoms should prompt the patient to seek immediate medical care, and risk reduction measures to take. Thank you for allowing [place] to participate in this patient's care.  Charisse March, RN, MSN, FNP-C [facility] Office: [phone]  Clinic MD: Edilia Bo 01/27/2014 9:44 AM

## 2014-01-27 NOTE — Addendum Note (Signed)
Addended by: Sharee PimpleMCCHESNEY, MARILYN K on: 01/27/2014 02:17 PM   Modules accepted: Orders

## 2014-01-27 NOTE — Patient Instructions (Addendum)
Stroke Prevention Some medical conditions and behaviors are associated with an increased chance of having a stroke. You may prevent a stroke by making healthy choices and managing medical conditions. HOW CAN I REDUCE MY RISK OF HAVING A STROKE?   Stay physically active. Get at least 30 minutes of activity on most or all days.  Do not smoke. It may also be helpful to avoid exposure to secondhand smoke.  Limit alcohol use. Moderate alcohol use is considered to be:  No more than 2 drinks per day for men.  No more than 1 drink per day for nonpregnant women.  Eat healthy foods. This involves:  Eating 5 or more servings of fruits and vegetables a day.  Making dietary changes that address high blood pressure (hypertension), high cholesterol, diabetes, or obesity.  Manage your cholesterol levels.  Making food choices that are high in fiber and low in saturated fat, trans fat, and cholesterol may control cholesterol levels.  Take any prescribed medicines to control cholesterol as directed by your health care provider.  Manage your diabetes.  Controlling your carbohydrate and sugar intake is recommended to manage diabetes.  Take any prescribed medicines to control diabetes as directed by your health care provider.  Control your hypertension.  Making food choices that are low in salt (sodium), saturated fat, trans fat, and cholesterol is recommended to manage hypertension.  Take any prescribed medicines to control hypertension as directed by your health care provider.  Maintain a healthy weight.  Reducing calorie intake and making food choices that are low in sodium, saturated fat, trans fat, and cholesterol are recommended to manage weight.  Stop drug abuse.  Avoid taking birth control pills.  Talk to your health care provider about the risks of taking birth control pills if you are over 35 years old, smoke, get migraines, or have ever had a blood clot.  Get evaluated for sleep  disorders (sleep apnea).  Talk to your health care provider about getting a sleep evaluation if you snore a lot or have excessive sleepiness.  Take medicines only as directed by your health care provider.  For some people, aspirin or blood thinners (anticoagulants) are helpful in reducing the risk of forming abnormal blood clots that can lead to stroke. If you have the irregular heart rhythm of atrial fibrillation, you should be on a blood thinner unless there is a good reason you cannot take them.  Understand all your medicine instructions.  Make sure that other conditions (such as anemia or atherosclerosis) are addressed. SEEK IMMEDIATE MEDICAL CARE IF:   You have sudden weakness or numbness of the face, arm, or leg, especially on one side of the body.  Your face or eyelid droops to one side.  You have sudden confusion.  You have trouble speaking (aphasia) or understanding.  You have sudden trouble seeing in one or both eyes.  You have sudden trouble walking.  You have dizziness.  You have a loss of balance or coordination.  You have a sudden, severe headache with no known cause.  You have new chest pain or an irregular heartbeat. Any of these symptoms may represent a serious problem that is an emergency. Do not wait to see if the symptoms will go away. Get medical help at once. Call your local emergency services (911 in [place] Do not drive yourself to the hospital. Document Released: 04/19/2004 Document Revised: 07/27/2013 Document Reviewed: 09/12/2012 [employer] Patient Information 2015 [employer]. This information is not intended to replace advice given   to you by your health care provider. Make sure you discuss any questions you have with your health care provider.   Peripheral Vascular Disease Peripheral Vascular Disease (PVD), also called Peripheral Arterial Disease (PAD), is a circulation problem caused by cholesterol (atherosclerotic plaque) deposits in the arteries.  PVD commonly occurs in the lower extremities (legs) but it can occur in other areas of the body, such as your arms. The cholesterol buildup in the arteries reduces blood flow which can cause pain and other serious problems. The presence of PVD can place a person at risk for Coronary Artery Disease (CAD).  CAUSES  Causes of PVD can be many. It is usually associated with more than one risk factor such as:   High Cholesterol.  Smoking.  Diabetes.  Lack of exercise or inactivity.  High blood pressure (hypertension).  Obesity.  Family history. SYMPTOMS   When the lower extremities are affected, patients with PVD may experience:  Leg pain with exertion or physical activity. This is called INTERMITTENT CLAUDICATION. This may present as cramping or numbness with physical activity. The location of the pain is associated with the level of blockage. For example, blockage at the abdominal level (distal abdominal aorta) may result in buttock or hip pain. Lower leg arterial blockage may result in calf pain.  As PVD becomes more severe, pain can develop with less physical activity.  In people with severe PVD, leg pain may occur at rest.  Other PVD signs and symptoms:  Leg numbness or weakness.  Coldness in the affected leg or foot, especially when compared to the other leg.  A change in leg color.  Patients with significant PVD are more prone to ulcers or sores on toes, feet or legs. These may take longer to heal or may reoccur. The ulcers or sores can become infected.  If signs and symptoms of PVD are ignored, gangrene may occur. This can result in the loss of toes or loss of an entire limb.  Not all leg pain is related to PVD. Other medical conditions can cause leg pain such as:  Blood clots (embolism) or Deep Vein Thrombosis.  Inflammation of the blood vessels (vasculitis).  Spinal stenosis. DIAGNOSIS  Diagnosis of PVD can involve several different types of tests. These can  include:  Pulse Volume Recording Method (PVR). This test is simple, painless and does not involve the use of X-rays. PVR involves measuring and comparing the blood pressure in the arms and legs. An ABI (Ankle-Brachial Index) is calculated. The normal ratio of blood pressures is 1. As this number becomes smaller, it indicates more severe disease.  < 0.95 - indicates significant narrowing in one or more leg vessels.  <0.8 - there will usually be pain in the foot, leg or buttock with exercise.  <0.4 - will usually have pain in the legs at rest.  <0.25 - usually indicates limb threatening PVD.  Doppler detection of pulses in the legs. This test is painless and checks to see if you have a pulses in your legs/feet.  A dye or contrast material (a substance that highlights the blood vessels so they show up on x-ray) may be given to help your caregiver better see the arteries for the following tests. The dye is eliminated from your body by the kidney's. Your caregiver may order blood work to check your kidney function and other laboratory values before the following tests are performed:  Magnetic Resonance Angiography (MRA). An MRA is a picture study of the blood vessels   and arteries. The MRA machine uses a large magnet to produce images of the blood vessels.  Computed Tomography Angiography (CTA). A CTA is a specialized x-ray that looks at how the blood flows in your blood vessels. An IV may be inserted into your arm so contrast dye can be injected.  Angiogram. Is a procedure that uses x-rays to look at your blood vessels. This procedure is minimally invasive, meaning a small incision (cut) is made in your groin. A small tube (catheter) is then inserted into the artery of your groin. The catheter is guided to the blood vessel or artery your caregiver wants to examine. Contrast dye is injected into the catheter. X-rays are then taken of the blood vessel or artery. After the images are obtained, the  catheter is taken out. TREATMENT  Treatment of PVD involves many interventions which may include:  Lifestyle changes:  Quitting smoking.  Exercise.  Following a low fat, low cholesterol diet.  Control of diabetes.  Foot care is very important to the PVD patient. Good foot care can help prevent infection.  Medication:  Cholesterol-lowering medicine.  Blood pressure medicine.  Anti-platelet drugs.  Certain medicines may reduce symptoms of Intermittent Claudication.  Interventional/Surgical options:  Angioplasty. An Angioplasty is a procedure that inflates a balloon in the blocked artery. This opens the blocked artery to improve blood flow.  Stent Implant. A wire mesh tube (stent) is placed in the artery. The stent expands and stays in place, allowing the artery to remain open.  Peripheral Bypass Surgery. This is a surgical procedure that reroutes the blood around a blocked artery to help improve blood flow. This type of procedure may be performed if Angioplasty or stent implants are not an option. SEEK IMMEDIATE MEDICAL CARE IF:   You develop pain or numbness in your arms or legs.  Your arm or leg turns cold, becomes blue in color.  You develop redness, warmth, swelling and pain in your arms or legs. MAKE SURE YOU:   Understand these instructions.  Will watch your condition.  Will get help right away if you are not doing well or get worse. Document Released: 04/19/2004 Document Revised: 06/04/2011 Document Reviewed: 03/16/2008 [employer] Patient Information 2015 [employer]. This information is not intended to replace advice given to you by your health care provider. Make sure you discuss any questions you have with your health care provider.   Smoking Cessation Quitting smoking is important to your health and has many advantages. However, it is not always easy to quit since nicotine is a very addictive drug. Oftentimes, people try 3 times or more before being able  to quit. This document explains the best ways for you to prepare to quit smoking. Quitting takes hard work and a lot of effort, but you can do it. ADVANTAGES OF QUITTING SMOKING  You will live longer, feel better, and live better.  Your body will feel the impact of quitting smoking almost immediately.  Within 20 minutes, blood pressure decreases. Your pulse returns to its normal level.  After 8 hours, carbon monoxide levels in the blood return to normal. Your oxygen level increases.  After 24 hours, the chance of having a heart attack starts to decrease. Your breath, hair, and body stop smelling like smoke.  After 48 hours, damaged nerve endings begin to recover. Your sense of taste and smell improve.  After 72 hours, the body is virtually free of nicotine. Your bronchial tubes relax and breathing becomes easier.  After 2 to   12 weeks, lungs can hold more air. Exercise becomes easier and circulation improves.  The risk of having a heart attack, stroke, cancer, or lung disease is greatly reduced.  After 1 year, the risk of coronary heart disease is cut in half.  After 5 years, the risk of stroke falls to the same as a nonsmoker.  After 10 years, the risk of lung cancer is cut in half and the risk of other cancers decreases significantly.  After 15 years, the risk of coronary heart disease drops, usually to the level of a nonsmoker.  If you are pregnant, quitting smoking will improve your chances of having a healthy baby.  The people you live with, especially any children, will be healthier.  You will have extra money to spend on things other than cigarettes. QUESTIONS TO THINK ABOUT BEFORE ATTEMPTING TO QUIT You may want to talk about your answers with your health care provider.  Why do you want to quit?  If you tried to quit in the past, what helped and what did not?  What will be the most difficult situations for you after you quit? How will you plan to handle them?  Who  can help you through the tough times? Your family? Friends? A health care provider?  What pleasures do you get from smoking? What ways can you still get pleasure if you quit? Here are some questions to ask your health care provider:  How can you help me to be successful at quitting?  What medicine do you think would be best for me and how should I take it?  What should I do if I need more help?  What is smoking withdrawal like? How can I get information on withdrawal? GET READY  Set a quit date.  Change your environment by getting rid of all cigarettes, ashtrays, matches, and lighters in your home, car, or work. Do not let people smoke in your home.  Review your past attempts to quit. Think about what worked and what did not. GET SUPPORT AND ENCOURAGEMENT You have a better chance of being successful if you have help. You can get support in many ways.  Tell your family, friends, and coworkers that you are going to quit and need their support. Ask them not to smoke around you.  Get individual, group, or telephone counseling and support. Programs are available at local hospitals and health centers. Call your local health department for information about programs in your area.  Spiritual beliefs and practices may help some smokers quit.  Download a "quit meter" on your computer to keep track of quit statistics, such as how long you have gone without smoking, cigarettes not smoked, and money saved.  Get a self-help book about quitting smoking and staying off tobacco. LEARN NEW SKILLS AND BEHAVIORS  Distract yourself from urges to smoke. Talk to someone, go for a walk, or occupy your time with a task.  Change your normal routine. Take a different route to work. Drink tea instead of coffee. Eat breakfast in a different place.  Reduce your stress. Take a hot bath, exercise, or read a book.  Plan something enjoyable to do every day. Reward yourself for not smoking.  Explore  interactive web-based programs that specialize in helping you quit. GET MEDICINE AND USE IT CORRECTLY Medicines can help you stop smoking and decrease the urge to smoke. Combining medicine with the above behavioral methods and support can greatly increase your chances of successfully quitting smoking.  Nicotine replacement   therapy helps deliver nicotine to your body without the negative effects and risks of smoking. Nicotine replacement therapy includes nicotine gum, lozenges, inhalers, nasal sprays, and skin patches. Some may be available over-the-counter and others require a prescription.  Antidepressant medicine helps people abstain from smoking, but how this works is unknown. This medicine is available by prescription.  Nicotinic receptor partial agonist medicine simulates the effect of nicotine in your brain. This medicine is available by prescription. Ask your health care provider for advice about which medicines to use and how to use them based on your health history. Your health care provider will tell you what side effects to look out for if you choose to be on a medicine or therapy. Carefully read the information on the package. Do not use any other product containing nicotine while using a nicotine replacement product.  RELAPSE OR DIFFICULT SITUATIONS Most relapses occur within the first 3 months after quitting. Do not be discouraged if you start smoking again. Remember, most people try several times before finally quitting. You may have symptoms of withdrawal because your body is used to nicotine. You may crave cigarettes, be irritable, feel very hungry, cough often, get headaches, or have difficulty concentrating. The withdrawal symptoms are only temporary. They are strongest when you first quit, but they will go away within 10-14 days. To reduce the chances of relapse, try to:  Avoid drinking alcohol. Drinking lowers your chances of successfully quitting.  Reduce the amount of caffeine  you consume. Once you quit smoking, the amount of caffeine in your body increases and can give you symptoms, such as a rapid heartbeat, sweating, and anxiety.  Avoid smokers because they can make you want to smoke.  Do not let weight gain distract you. Many smokers will gain weight when they quit, usually less than 10 pounds. Eat a healthy diet and stay active. You can always lose the weight gained after you quit.  Find ways to improve your mood other than smoking. FOR MORE INFORMATION  www.smokefree.gov  Document Released: 03/06/2001 Document Revised: 07/27/2013 Document Reviewed: 06/21/2011 [employer] Patient Information 2015 [employer]. This information is not intended to replace advice given to you by your health care provider. Make sure you discuss any questions you have with your health care provider.  

## 2014-07-27 ENCOUNTER — Encounter: Payer: Self-pay | Admitting: Family

## 2014-07-28 ENCOUNTER — Encounter: Payer: Self-pay | Admitting: Family

## 2014-07-28 ENCOUNTER — Ambulatory Visit (INDEPENDENT_AMBULATORY_CARE_PROVIDER_SITE_OTHER): Payer: Medicare PPO | Admitting: Family

## 2014-07-28 ENCOUNTER — Ambulatory Visit (INDEPENDENT_AMBULATORY_CARE_PROVIDER_SITE_OTHER)
Admission: RE | Admit: 2014-07-28 | Discharge: 2014-07-28 | Disposition: A | Discharge status: Home / Self Care | Payer: Medicare PPO | Source: Ambulatory Visit | Attending: Family | Admitting: Family

## 2014-07-28 ENCOUNTER — Ambulatory Visit (HOSPITAL_COMMUNITY)
Admission: RE | Admit: 2014-07-28 | Discharge: 2014-07-28 | Disposition: A | Discharge status: Home / Self Care | Payer: Medicare PPO | Source: Ambulatory Visit | Attending: Family | Admitting: Family

## 2014-07-28 VITALS — BP 153/73 | HR 67 | Resp 16 | Ht 66.0 in | Wt 203.0 lb

## 2014-07-28 DIAGNOSIS — E1151 Type 2 diabetes mellitus with diabetic peripheral angiopathy without gangrene: Secondary | ICD-10-CM

## 2014-07-28 DIAGNOSIS — I6523 Occlusion and stenosis of bilateral carotid arteries: Secondary | ICD-10-CM | POA: Diagnosis not present

## 2014-07-28 DIAGNOSIS — Z720 Tobacco use: Secondary | ICD-10-CM | POA: Diagnosis not present

## 2014-07-28 DIAGNOSIS — F17200 Nicotine dependence, unspecified, uncomplicated: Secondary | ICD-10-CM

## 2014-07-28 DIAGNOSIS — E1159 Type 2 diabetes mellitus with other circulatory complications: Secondary | ICD-10-CM | POA: Diagnosis not present

## 2014-07-28 DIAGNOSIS — I739 Peripheral vascular disease, unspecified: Secondary | ICD-10-CM | POA: Diagnosis not present

## 2014-07-28 DIAGNOSIS — I7090 Unspecified atherosclerosis: Secondary | ICD-10-CM

## 2014-07-28 LAB — VAS US CAROTID
LCCADDIAS: 10 cm/s
LCCADSYS: 49 cm/s
LCCAPSYS: 101 cm/s
LEFT CCA MID DIAS: 15 R/I
LEFT CCA MID SYS: 83 cm/s
LEFT ECA DIAS: 8 cm/s
LICADDIAS: 23 cm/s
LICADSYS: 140 cm/s
Left CCA prox dias: 13 cm/s
Left ECA sys: 56 cm/s
Left ICA mid dias: 52 cm/s
Left ICA mid sys: 242 cm/s
Left ICA prox dias: 36 cm/s
Left ICA prox sys: 152 cm/s
RCCADDIAS: 15 cm/s
RCCADSYS: 59 cm/s
RECASYS: 141 cm/s
RICADSYS: 97 cm/s
RICAPDIAS: 17 cm/s
RIGHT CCA MID DIAS: 14 cm/s
RIGHT CCA MID SYS: 75 cm/s
RIGHT ECA DIAS: 12 cm/s
Right CCA prox dias: 11 cm/s
Right CCA prox sys: 68 cm/s
Right ICA dist dias: 18 cm/s
Right ICA mid dias: 110 cm/s
Right ICA mid sys: 382 cm/s
Right ICA prox sys: 83 cm/s

## 2014-07-28 NOTE — Progress Notes (Signed)
[facility] [place] HISTORY AND PHYSICAL   MRN : [id number]  History of Present Illness:   Barry Shepherd is a 69 y.o. male patient of Dr. Edilia Bo who returns today for follow up of known bilateral carotid disease and peripheral vascular disease.  He has known left SFA occlusion.   With respect to his carotid disease. He denies any history of stroke, TIAs, expressive or receptive aphasia, or amaurosis fugax.   He denies nonhealing ulcers. 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 He reports he had pain in the left buttocks after walking about 100-200 feet, relieved with rest; but he states that he has been diagnosed with bilateral hip arthritis and lumbar spine issues. He has no pain in his right hip/leg.  Patient has not had previous carotid artery intervention.  Patient reports New Medical or Surgical History: had a stress test February 2016, states result was fine. Also states his COPD testing was good recently.  Pt Diabetic: Yes, self reported home glucose 117-200, sounds almost in control Pt smoker: smoker (1 ppd, started at age 72 yrs)  Pt meds include: Statin : No ASA: Yes Other anticoagulants/antiplatelets: no    Current Outpatient Prescriptions  Medication Sig Dispense Refill  . ACCU-CHEK AVIVA PLUS test strip     . ACCU-CHEK SOFTCLIX LANCETS lancets     . aspirin 81 MG tablet Take 81 mg by mouth every morning.    Marland Kitchen glipiZIDE (GLUCOTROL) 10 MG tablet Take 10 mg by mouth 2 (two) times daily before a meal.    . lisinopril (PRINIVIL,ZESTRIL) 40 MG tablet Take 40 mg by mouth daily.    . meloxicam (MOBIC) 15 MG tablet Take 15 mg by mouth daily.    . metFORMIN (GLUCOPHAGE) 1000 MG tablet Take 1,000 mg by mouth 2 (two) times daily with a meal.    . metoprolol succinate (TOPROL-XL) 25 MG 24 hr tablet Take 25 mg by  mouth daily.    . Omega-3 Fatty Acids (FISH OIL) 1000 MG CAPS Take by mouth.    . Tamsulosin HCl (FLOMAX) 0.4 MG CAPS Take 0.4 mg by mouth daily.     No current facility-administered medications for this visit.    Past Medical History  Diagnosis Date  . Diabetes mellitus without complication   . Carotid artery occlusion   . Hypertension   . Hyperlipidemia     Social History History  Substance Use Topics  . Smoking status: Current Every Day Smoker -- 1.50 packs/day for 50 years    Types: Cigarettes  . Smokeless tobacco: Never Used  . Alcohol Use: No    Family History Family History  Problem Relation Age of Onset  . Heart disease Father     before age 69  . Hyperlipidemia Father   . Hypertension Father   . Heart attack Father   . Heart disease Mother   . Hypertension Mother   . Heart disease Brother     After age 31    Surgical History Past Surgical History  Procedure Laterality Date  . Cholecystectomy    . Cardiovascular stress test  Oct. 2015    No Known Allergies  Current Outpatient Prescriptions  Medication Sig Dispense Refill  . ACCU-CHEK AVIVA PLUS test strip     . ACCU-CHEK SOFTCLIX LANCETS lancets     . aspirin 81 MG  tablet Take 81 mg by mouth every morning.    Marland Kitchen. glipiZIDE (GLUCOTROL) 10 MG tablet Take 10 mg by mouth 2 (two) times daily before a meal.    . lisinopril (PRINIVIL,ZESTRIL) 40 MG tablet Take 40 mg by mouth daily.    . meloxicam (MOBIC) 15 MG tablet Take 15 mg by mouth daily.    . metFORMIN (GLUCOPHAGE) 1000 MG tablet Take 1,000 mg by mouth 2 (two) times daily with a meal.    . metoprolol succinate (TOPROL-XL) 25 MG 24 hr tablet Take 25 mg by mouth daily.    . Omega-3 Fatty Acids (FISH OIL) 1000 MG CAPS Take by mouth.    . Tamsulosin HCl (FLOMAX) 0.4 MG CAPS Take 0.4 mg by mouth daily.     No current facility-administered medications for this visit.     REVIEW OF SYSTEMS: See HPI for pertinent positives and negatives.  Physical  Examination . Filed Vitals:   07/28/14 1118 07/28/14 1121 07/28/14 1130 07/28/14 1132  BP: 164/70 166/75 148/69 153/73  Pulse: 66 66 65 67  Resp:  16    Height:  5\' 6"  (1.676 m)    Weight:  203 lb (92.08 kg)    SpO2:  99%     Body mass index is 32.78 kg/(m^2).   General: WDWN obese male in NAD GAIT: normal Eyes: PERRLA Pulmonary: Non-labored, limited air movement in all fields, Negative Rales, Negative rhonchi, & Negative wheezing.  Cardiac: regular Rhythm, positive murmur.  VASCULAR EXAM Carotid Bruits Left Right   Transmitted cardiac murmur Transmitted cardiac murmur   Aorta is not palpable. Radial pulses are 2+ palpable and equal.      LE Pulses LEFT RIGHT   FEMORAL 1+ palpable 2+ palpable    POPLITEAL not palpable  not palpable   POSTERIOR TIBIAL not palpable   1+palpable    DORSALIS PEDIS  ANTERIOR TIBIAL 1+ palpable  not palpable     Gastrointestinal: soft, nontender, BS WNL, no r/g, no palpated masses, large panus.  Musculoskeletal: Negative muscle atrophy/wasting. M/S 5/5 throughout, Extremities without ischemic changes  Neurologic: A&O X 3; Appropriate Affect ; SENSATION ;normal;  Speech is normal CN 2-12 intact, Pain and light touch intact in extremities, Motor exam as listed above.         Non-Invasive Vascular Imaging (07/28/2014):   CEREBROVASCULAR DUPLEX EVALUATION    INDICATION: Carotid artery disease     PREVIOUS INTERVENTION(S):     DUPLEX EXAM:     RIGHT  LEFT  Peak Systolic Velocities (cm/s) End Diastolic Velocities (cm/s) Plaque LOCATION Peak Systolic Velocities (cm/s) End Diastolic Velocities (cm/s) Plaque  68 11  CCA PROXIMAL 101 13   75 14  CCA MID 83 13   59 15  CCA DISTAL 49 10 CP  141 12 HT ECA 56 8   83 17 HT ICA PROXIMAL  152 36 CP  382 110  ICA MID 242 52   97 18  ICA DISTAL 140 23     5.09 ICA / CCA Ratio (PSV) 2.91  Antegrade  Vertebral Flow Antegrade   173 Brachial Systolic Pressure (mmHg) 175  Multiphasic (Subclavian artery) Brachial Artery Waveforms Multiphasic (Subclavian artery)    Plaque Morphology:  HM = Homogeneous, HT = Heterogeneous, CP = Calcific Plaque, SP = Smooth Plaque, IP = Irregular Plaque  ADDITIONAL FINDINGS:     IMPRESSION: Right internal carotid artery velocities suggest a 70% stenosis (top end of range). Left internal carotid artery velocities suggest a 50-69% stenosis;  however, velocities most likely are underestimated due to large calcific plaque with acoustic shadowing which makes Doppler interrogation difficult.      Compared to the previous exam:  No significant change in comparison to the last exam on 01/12/2014.     ASSESSMENT:  Georgetta HaberBilly Little is a 69 y.o. male who has bilateral ICA stenoses, and mild PAD in his left LE with a known left SFA occlusion. The pain in his left buttock and leg is not c/w PAD but is c/w his known lumbar spine/left radiculopathy issues. He has been seen in a pain management clinic for this. He has no right LE pain. He has no non healing wounds in his feet or lower legs. He has no history of stroke or TIA.  Today's carotid Duplex suggests 70% stenosis (top end of range) of the right internal carotid artery and  50-69% stenosis of the left ICA; right mid ICA end diastolic velocity is 110 cm/s. No significant change in comparison to the last exam on 01/12/2014.  His diabetes seems to be under better control, but unfortunately he continues to smoke a ppd, which is a decrease in use for him. Pt reports a February 2016 cardiac stress test as being normal; he reports what sounds like recent pulmonary function tests as being close to normal. See Plan.   PLAN:   The patient was counseled re smoking cessation and given several free resources re  smoking cessation. Graduated walking program.   Based on today's exam and non-invasive vascular lab results, the patient will follow up  with the following tests: 3 months for carotid Duplex, ABI's in 6 months.  I discussed in depth with the patient the nature of atherosclerosis, and emphasized the importance of maximal medical management including strict control of blood pressure, blood glucose, and lipid levels, obtaining regular exercise, and cessation of smoking.  The patient is aware that without maximal medical management the underlying atherosclerotic disease process will progress, limiting the benefit of any interventions.  The patient was given information about stroke prevention and what symptoms should prompt the patient to seek immediate medical care.  The patient was given information about PAD including signs, symptoms, treatment, what symptoms should prompt the patient to seek immediate medical care, and risk reduction measures to take. Thank you for allowing us to participate in this patient's care.  Charisse MarchSuzanne Kirra Verga, RN, MSN, FNP-C [facility] Office: [phone]  Clinic MD: Edilia BoDickson 07/28/2014 11:21 AM

## 2014-07-28 NOTE — Progress Notes (Signed)
Filed Vitals:   07/28/14 1118 07/28/14 1121 07/28/14 1130 07/28/14 1132  BP: 164/70 166/75 148/69 153/73  Pulse: 66 66 65 67  Resp:  16    Height:  5\' 6"  (1.676 m)    Weight:  203 lb (92.08 kg)    SpO2:  99%

## 2014-07-28 NOTE — Patient Instructions (Signed)
Stroke Prevention Some medical conditions and behaviors are associated with an increased chance of having a stroke. You may prevent a stroke by making healthy choices and managing medical conditions. HOW CAN I REDUCE MY RISK OF HAVING A STROKE?   Stay physically active. Get at least 30 minutes of activity on most or all days.  Do not smoke. It may also be helpful to avoid exposure to secondhand smoke.  Limit alcohol use. Moderate alcohol use is considered to be:  No more than 2 drinks per day for men.  No more than 1 drink per day for nonpregnant women.  Eat healthy foods. This involves:  Eating 5 or more servings of fruits and vegetables a day.  Making dietary changes that address high blood pressure (hypertension), high cholesterol, diabetes, or obesity.  Manage your cholesterol levels.  Making food choices that are high in fiber and low in saturated fat, trans fat, and cholesterol may control cholesterol levels.  Take any prescribed medicines to control cholesterol as directed by your health care provider.  Manage your diabetes.  Controlling your carbohydrate and sugar intake is recommended to manage diabetes.  Take any prescribed medicines to control diabetes as directed by your health care provider.  Control your hypertension.  Making food choices that are low in salt (sodium), saturated fat, trans fat, and cholesterol is recommended to manage hypertension.  Take any prescribed medicines to control hypertension as directed by your health care provider.  Maintain a healthy weight.  Reducing calorie intake and making food choices that are low in sodium, saturated fat, trans fat, and cholesterol are recommended to manage weight.  Stop drug abuse.  Avoid taking birth control pills.  Talk to your health care provider about the risks of taking birth control pills if you are over 35 years old, smoke, get migraines, or have ever had a blood clot.  Get evaluated for sleep  disorders (sleep apnea).  Talk to your health care provider about getting a sleep evaluation if you snore a lot or have excessive sleepiness.  Take medicines only as directed by your health care provider.  For some people, aspirin or blood thinners (anticoagulants) are helpful in reducing the risk of forming abnormal blood clots that can lead to stroke. If you have the irregular heart rhythm of atrial fibrillation, you should be on a blood thinner unless there is a good reason you cannot take them.  Understand all your medicine instructions.  Make sure that other conditions (such as anemia or atherosclerosis) are addressed. SEEK IMMEDIATE MEDICAL CARE IF:   You have sudden weakness or numbness of the face, arm, or leg, especially on one side of the body.  Your face or eyelid droops to one side.  You have sudden confusion.  You have trouble speaking (aphasia) or understanding.  You have sudden trouble seeing in one or both eyes.  You have sudden trouble walking.  You have dizziness.  You have a loss of balance or coordination.  You have a sudden, severe headache with no known cause.  You have new chest pain or an irregular heartbeat. Any of these symptoms may represent a serious problem that is an emergency. Do not wait to see if the symptoms will go away. Get medical help at once. Call your local emergency services (911 in [place] Do not drive yourself to the hospital. Document Released: 04/19/2004 Document Revised: 07/27/2013 Document Reviewed: 09/12/2012 [employer] Patient Information 2015 [employer]. This information is not intended to replace advice given   to you by your health care provider. Make sure you discuss any questions you have with your health care provider.     Peripheral Vascular Disease Peripheral Vascular Disease (PVD), also called Peripheral Arterial Disease (PAD), is a circulation problem caused by cholesterol (atherosclerotic plaque) deposits in the  arteries. PVD commonly occurs in the lower extremities (legs) but it can occur in other areas of the body, such as your arms. The cholesterol buildup in the arteries reduces blood flow which can cause pain and other serious problems. The presence of PVD can place a person at risk for Coronary Artery Disease (CAD).  CAUSES  Causes of PVD can be many. It is usually associated with more than one risk factor such as:   High Cholesterol.  Smoking.  Diabetes.  Lack of exercise or inactivity.  High blood pressure (hypertension).  Obesity.  Family history. SYMPTOMS   When the lower extremities are affected, patients with PVD may experience:  Leg pain with exertion or physical activity. This is called INTERMITTENT CLAUDICATION. This may present as cramping or numbness with physical activity. The location of the pain is associated with the level of blockage. For example, blockage at the abdominal level (distal abdominal aorta) may result in buttock or hip pain. Lower leg arterial blockage may result in calf pain.  As PVD becomes more severe, pain can develop with less physical activity.  In people with severe PVD, leg pain may occur at rest.  Other PVD signs and symptoms:  Leg numbness or weakness.  Coldness in the affected leg or foot, especially when compared to the other leg.  A change in leg color.  Patients with significant PVD are more prone to ulcers or sores on toes, feet or legs. These may take longer to heal or may reoccur. The ulcers or sores can become infected.  If signs and symptoms of PVD are ignored, gangrene may occur. This can result in the loss of toes or loss of an entire limb.  Not all leg pain is related to PVD. Other medical conditions can cause leg pain such as:  Blood clots (embolism) or Deep Vein Thrombosis.  Inflammation of the blood vessels (vasculitis).  Spinal stenosis. DIAGNOSIS  Diagnosis of PVD can involve several different types of tests. These  can include:  Pulse Volume Recording Method (PVR). This test is simple, painless and does not involve the use of X-rays. PVR involves measuring and comparing the blood pressure in the arms and legs. An ABI (Ankle-Brachial Index) is calculated. The normal ratio of blood pressures is 1. As this number becomes smaller, it indicates more severe disease.  < 0.95 - indicates significant narrowing in one or more leg vessels.  <0.8 - there will usually be pain in the foot, leg or buttock with exercise.  <0.4 - will usually have pain in the legs at rest.  <0.25 - usually indicates limb threatening PVD.  Doppler detection of pulses in the legs. This test is painless and checks to see if you have a pulses in your legs/feet.  A dye or contrast material (a substance that highlights the blood vessels so they show up on x-ray) may be given to help your caregiver better see the arteries for the following tests. The dye is eliminated from your body by the kidney's. Your caregiver may order blood work to check your kidney function and other laboratory values before the following tests are performed:  Magnetic Resonance Angiography (MRA). An MRA is a picture study of the   blood vessels and arteries. The MRA machine uses a large magnet to produce images of the blood vessels.  Computed Tomography Angiography (CTA). A CTA is a specialized x-ray that looks at how the blood flows in your blood vessels. An IV may be inserted into your arm so contrast dye can be injected.  Angiogram. Is a procedure that uses x-rays to look at your blood vessels. This procedure is minimally invasive, meaning a small incision (cut) is made in your groin. A small tube (catheter) is then inserted into the artery of your groin. The catheter is guided to the blood vessel or artery your caregiver wants to examine. Contrast dye is injected into the catheter. X-rays are then taken of the blood vessel or artery. After the images are obtained, the  catheter is taken out. TREATMENT  Treatment of PVD involves many interventions which may include:  Lifestyle changes:  Quitting smoking.  Exercise.  Following a low fat, low cholesterol diet.  Control of diabetes.  Foot care is very important to the PVD patient. Good foot care can help prevent infection.  Medication:  Cholesterol-lowering medicine.  Blood pressure medicine.  Anti-platelet drugs.  Certain medicines may reduce symptoms of Intermittent Claudication.  Interventional/Surgical options:  Angioplasty. An Angioplasty is a procedure that inflates a balloon in the blocked artery. This opens the blocked artery to improve blood flow.  Stent Implant. A wire mesh tube (stent) is placed in the artery. The stent expands and stays in place, allowing the artery to remain open.  Peripheral Bypass Surgery. This is a surgical procedure that reroutes the blood around a blocked artery to help improve blood flow. This type of procedure may be performed if Angioplasty or stent implants are not an option. SEEK IMMEDIATE MEDICAL CARE IF:   You develop pain or numbness in your arms or legs.  Your arm or leg turns cold, becomes blue in color.  You develop redness, warmth, swelling and pain in your arms or legs. MAKE SURE YOU:   Understand these instructions.  Will watch your condition.  Will get help right away if you are not doing well or get worse. Document Released: 04/19/2004 Document Revised: 06/04/2011 Document Reviewed: 03/16/2008 Exit[employer] Patient Information 2015 [employer]. This information is not intended to replace advice given to you by your health care provider. Make sure you discuss any questions you have with your health care provider.    Smoking Cessation Quitting smoking is important to your health and has many advantages. However, it is not always easy to quit since nicotine is a very addictive drug. Oftentimes, people try 3 times or more before being  able to quit. This document explains the best ways for you to prepare to quit smoking. Quitting takes hard work and a lot of effort, but you can do it. ADVANTAGES OF QUITTING SMOKING  You will live longer, feel better, and live better.  Your body will feel the impact of quitting smoking almost immediately.  Within 20 minutes, blood pressure decreases. Your pulse returns to its normal level.  After 8 hours, carbon monoxide levels in the blood return to normal. Your oxygen level increases.  After 24 hours, the chance of having a heart attack starts to decrease. Your breath, hair, and body stop smelling like smoke.  After 48 hours, damaged nerve endings begin to recover. Your sense of taste and smell improve.  After 72 hours, the body is virtually free of nicotine. Your bronchial tubes relax and breathing becomes easier.    After 2 to 12 weeks, lungs can hold more air. Exercise becomes easier and circulation improves.  The risk of having a heart attack, stroke, cancer, or lung disease is greatly reduced.  After 1 year, the risk of coronary heart disease is cut in half.  After 5 years, the risk of stroke falls to the same as a nonsmoker.  After 10 years, the risk of lung cancer is cut in half and the risk of other cancers decreases significantly.  After 15 years, the risk of coronary heart disease drops, usually to the level of a nonsmoker.  If you are pregnant, quitting smoking will improve your chances of having a healthy baby.  The people you live with, especially any children, will be healthier.  You will have extra money to spend on things other than cigarettes. QUESTIONS TO THINK ABOUT BEFORE ATTEMPTING TO QUIT You may want to talk about your answers with your health care provider.  Why do you want to quit?  If you tried to quit in the past, what helped and what did not?  What will be the most difficult situations for you after you quit? How will you plan to handle  them?  Who can help you through the tough times? Your family? Friends? A health care provider?  What pleasures do you get from smoking? What ways can you still get pleasure if you quit? Here are some questions to ask your health care provider:  How can you help me to be successful at quitting?  What medicine do you think would be best for me and how should I take it?  What should I do if I need more help?  What is smoking withdrawal like? How can I get information on withdrawal? GET READY  Set a quit date.  Change your environment by getting rid of all cigarettes, ashtrays, matches, and lighters in your home, car, or work. Do not let people smoke in your home.  Review your past attempts to quit. Think about what worked and what did not. GET SUPPORT AND ENCOURAGEMENT You have a better chance of being successful if you have help. You can get support in many ways.  Tell your family, friends, and coworkers that you are going to quit and need their support. Ask them not to smoke around you.  Get individual, group, or telephone counseling and support. Programs are available at local hospitals and health centers. Call your local health department for information about programs in your area.  Spiritual beliefs and practices may help some smokers quit.  Download a "quit meter" on your computer to keep track of quit statistics, such as how long you have gone without smoking, cigarettes not smoked, and money saved.  Get a self-help book about quitting smoking and staying off tobacco. LEARN NEW SKILLS AND BEHAVIORS  Distract yourself from urges to smoke. Talk to someone, go for a walk, or occupy your time with a task.  Change your normal routine. Take a different route to work. Drink tea instead of coffee. Eat breakfast in a different place.  Reduce your stress. Take a hot bath, exercise, or read a book.  Plan something enjoyable to do every day. Reward yourself for not  smoking.  Explore interactive web-based programs that specialize in helping you quit. GET MEDICINE AND USE IT CORRECTLY Medicines can help you stop smoking and decrease the urge to smoke. Combining medicine with the above behavioral methods and support can greatly increase your chances of successfully quitting smoking.    Nicotine replacement therapy helps deliver nicotine to your body without the negative effects and risks of smoking. Nicotine replacement therapy includes nicotine gum, lozenges, inhalers, nasal sprays, and skin patches. Some may be available over-the-counter and others require a prescription.  Antidepressant medicine helps people abstain from smoking, but how this works is unknown. This medicine is available by prescription.  Nicotinic receptor partial agonist medicine simulates the effect of nicotine in your brain. This medicine is available by prescription. Ask your health care provider for advice about which medicines to use and how to use them based on your health history. Your health care provider will tell you what side effects to look out for if you choose to be on a medicine or therapy. Carefully read the information on the package. Do not use any other product containing nicotine while using a nicotine replacement product.  RELAPSE OR DIFFICULT SITUATIONS Most relapses occur within the first 3 months after quitting. Do not be discouraged if you start smoking again. Remember, most people try several times before finally quitting. You may have symptoms of withdrawal because your body is used to nicotine. You may crave cigarettes, be irritable, feel very hungry, cough often, get headaches, or have difficulty concentrating. The withdrawal symptoms are only temporary. They are strongest when you first quit, but they will go away within 10-14 days. To reduce the chances of relapse, try to:  Avoid drinking alcohol. Drinking lowers your chances of successfully quitting.  Reduce the  amount of caffeine you consume. Once you quit smoking, the amount of caffeine in your body increases and can give you symptoms, such as a rapid heartbeat, sweating, and anxiety.  Avoid smokers because they can make you want to smoke.  Do not let weight gain distract you. Many smokers will gain weight when they quit, usually less than 10 pounds. Eat a healthy diet and stay active. You can always lose the weight gained after you quit.  Find ways to improve your mood other than smoking. FOR MORE INFORMATION  www.smokefree.gov  Document Released: 03/06/2001 Document Revised: 07/27/2013 Document Reviewed: 06/21/2011 [employer] Patient Information 2015 [employer]. This information is not intended to replace advice given to you by your health care provider. Make sure you discuss any questions you have with your health care provider.    Smoking Cessation, Tips for Success If you are ready to quit smoking, congratulations! You have chosen to help yourself be healthier. Cigarettes bring nicotine, tar, carbon monoxide, and other irritants into your body. Your lungs, heart, and blood vessels will be able to work better without these poisons. There are many different ways to quit smoking. Nicotine gum, nicotine patches, a nicotine inhaler, or nicotine nasal spray can help with physical craving. Hypnosis, support groups, and medicines help break the habit of smoking. WHAT THINGS CAN I DO TO MAKE QUITTING EASIER?  Here are some tips to help you quit for good:  Pick a date when you will quit smoking completely. Tell all of your friends and family about your plan to quit on that date.  Do not try to slowly cut down on the number of cigarettes you are smoking. Pick a quit date and quit smoking completely starting on that day.  Throw away all cigarettes.   Clean and remove all ashtrays from your home, work, and car.  On a card, write down your reasons for quitting. Carry the card with you and read it  when you get the urge to smoke.  Cleanse your body   of nicotine. Drink enough water and fluids to keep your urine clear or pale yellow. Do this after quitting to flush the nicotine from your body.  Learn to predict your moods. Do not let a bad situation be your excuse to have a cigarette. Some situations in your life might tempt you into wanting a cigarette.  Never have "just one" cigarette. It leads to wanting another and another. Remind yourself of your decision to quit.  Change habits associated with smoking. If you smoked while driving or when feeling stressed, try other activities to replace smoking. Stand up when drinking your coffee. Brush your teeth after eating. Sit in a different chair when you read the paper. Avoid alcohol while trying to quit, and try to drink fewer caffeinated beverages. Alcohol and caffeine may urge you to smoke.  Avoid foods and drinks that can trigger a desire to smoke, such as sugary or spicy foods and alcohol.  Ask people who smoke not to smoke around you.  Have something planned to do right after eating or having a cup of coffee. For example, plan to take a walk or exercise.  Try a relaxation exercise to calm you down and decrease your stress. Remember, you may be tense and nervous for the first 2 weeks after you quit, but this will pass.  Find new activities to keep your hands busy. Play with a pen, coin, or rubber band. Doodle or draw things on paper.  Brush your teeth right after eating. This will help cut down on the craving for the taste of tobacco after meals. You can also try mouthwash.   Use oral substitutes in place of cigarettes. Try using lemon drops, carrots, cinnamon sticks, or chewing gum. Keep them handy so they are available when you have the urge to smoke.  When you have the urge to smoke, try deep breathing.  Designate your home as a nonsmoking area.  If you are a heavy smoker, ask your health care provider about a prescription for  nicotine chewing gum. It can ease your withdrawal from nicotine.  Reward yourself. Set aside the cigarette money you save and buy yourself something nice.  Look for support from others. Join a support group or smoking cessation program. Ask someone at home or at work to help you with your plan to quit smoking.  Always ask yourself, "Do I need this cigarette or is this just a reflex?" Tell yourself, "Today, I choose not to smoke," or "I do not want to smoke." You are reminding yourself of your decision to quit.  Do not replace cigarette smoking with electronic cigarettes (commonly called e-cigarettes). The safety of e-cigarettes is unknown, and some may contain harmful chemicals.  If you relapse, do not give up! Plan ahead and think about what you will do the next time you get the urge to smoke. HOW WILL I FEEL WHEN I QUIT SMOKING? You may have symptoms of withdrawal because your body is used to nicotine (the addictive substance in cigarettes). You may crave cigarettes, be irritable, feel very hungry, cough often, get headaches, or have difficulty concentrating. The withdrawal symptoms are only temporary. They are strongest when you first quit but will go away within 10-14 days. When withdrawal symptoms occur, stay in control. Think about your reasons for quitting. Remind yourself that these are signs that your body is healing and getting used to being without cigarettes. Remember that withdrawal symptoms are easier to treat than the major diseases that smoking can cause.    Even after the withdrawal is over, expect periodic urges to smoke. However, these cravings are generally short lived and will go away whether you smoke or not. Do not smoke! WHAT RESOURCES ARE AVAILABLE TO HELP ME QUIT SMOKING? Your health care provider can direct you to community resources or hospitals for support, which may include:  Group support.  Education.  Hypnosis.  Therapy. Document Released: 12/09/2003 Document  Revised: 07/27/2013 Document Reviewed: 08/28/2012 [employer] Patient Information 2015 [employer]. This information is not intended to replace advice given to you by your health care provider. Make sure you discuss any questions you have with your health care provider.  

## 2014-07-29 NOTE — Addendum Note (Signed)
Addended by: Sharee PimpleMCCHESNEY, Emmeline Winebarger K on: 07/29/2014 12:06 PM   Modules accepted: Orders

## 2014-11-09 ENCOUNTER — Encounter: Payer: Self-pay | Admitting: Family

## 2014-11-10 ENCOUNTER — Other Ambulatory Visit: Payer: Self-pay

## 2014-11-10 ENCOUNTER — Ambulatory Visit (INDEPENDENT_AMBULATORY_CARE_PROVIDER_SITE_OTHER)
Admission: RE | Admit: 2014-11-10 | Discharge: 2014-11-10 | Disposition: A | Discharge status: Home / Self Care | Payer: Medicare PPO | Source: Ambulatory Visit | Attending: Family | Admitting: Family

## 2014-11-10 ENCOUNTER — Ambulatory Visit (HOSPITAL_COMMUNITY)
Admission: RE | Admit: 2014-11-10 | Discharge: 2014-11-10 | Disposition: A | Discharge status: Home / Self Care | Payer: Medicare PPO | Source: Ambulatory Visit | Attending: Family | Admitting: Family

## 2014-11-10 ENCOUNTER — Encounter: Payer: Self-pay | Admitting: Family

## 2014-11-10 ENCOUNTER — Ambulatory Visit (INDEPENDENT_AMBULATORY_CARE_PROVIDER_SITE_OTHER): Payer: Medicare PPO | Admitting: Family

## 2014-11-10 VITALS — BP 168/77 | HR 65 | Ht 66.0 in | Wt 203.4 lb

## 2014-11-10 DIAGNOSIS — I739 Peripheral vascular disease, unspecified: Secondary | ICD-10-CM

## 2014-11-10 DIAGNOSIS — I6523 Occlusion and stenosis of bilateral carotid arteries: Secondary | ICD-10-CM | POA: Diagnosis not present

## 2014-11-10 DIAGNOSIS — E1151 Type 2 diabetes mellitus with diabetic peripheral angiopathy without gangrene: Secondary | ICD-10-CM

## 2014-11-10 DIAGNOSIS — I10 Essential (primary) hypertension: Secondary | ICD-10-CM | POA: Insufficient documentation

## 2014-11-10 DIAGNOSIS — F17200 Nicotine dependence, unspecified, uncomplicated: Secondary | ICD-10-CM

## 2014-11-10 DIAGNOSIS — E1159 Type 2 diabetes mellitus with other circulatory complications: Secondary | ICD-10-CM | POA: Diagnosis not present

## 2014-11-10 DIAGNOSIS — Z720 Tobacco use: Secondary | ICD-10-CM | POA: Insufficient documentation

## 2014-11-10 NOTE — Patient Instructions (Signed)
Stroke Prevention Some medical conditions and behaviors are associated with an increased chance of having a stroke. You may prevent a stroke by making healthy choices and managing medical conditions. HOW CAN I REDUCE MY RISK OF HAVING A STROKE?   Stay physically active. Get at least 30 minutes of activity on most or all days.  Do not smoke. It may also be helpful to avoid exposure to secondhand smoke.  Limit alcohol use. Moderate alcohol use is considered to be:  No more than 2 drinks per day for men.  No more than 1 drink per day for nonpregnant women.  Eat healthy foods. This involves:  Eating 5 or more servings of fruits and vegetables a day.  Making dietary changes that address high blood pressure (hypertension), high cholesterol, diabetes, or obesity.  Manage your cholesterol levels.  Making food choices that are high in fiber and low in saturated fat, trans fat, and cholesterol may control cholesterol levels.  Take any prescribed medicines to control cholesterol as directed by your health care provider.  Manage your diabetes.  Controlling your carbohydrate and sugar intake is recommended to manage diabetes.  Take any prescribed medicines to control diabetes as directed by your health care provider.  Control your hypertension.  Making food choices that are low in salt (sodium), saturated fat, trans fat, and cholesterol is recommended to manage hypertension.  Take any prescribed medicines to control hypertension as directed by your health care provider.  Maintain a healthy weight.  Reducing calorie intake and making food choices that are low in sodium, saturated fat, trans fat, and cholesterol are recommended to manage weight.  Stop drug abuse.  Avoid taking birth control pills.  Talk to your health care provider about the risks of taking birth control pills if you are over 35 years old, smoke, get migraines, or have ever had a blood clot.  Get evaluated for sleep  disorders (sleep apnea).  Talk to your health care provider about getting a sleep evaluation if you snore a lot or have excessive sleepiness.  Take medicines only as directed by your health care provider.  For some people, aspirin or blood thinners (anticoagulants) are helpful in reducing the risk of forming abnormal blood clots that can lead to stroke. If you have the irregular heart rhythm of atrial fibrillation, you should be on a blood thinner unless there is a good reason you cannot take them.  Understand all your medicine instructions.  Make sure that other conditions (such as anemia or atherosclerosis) are addressed. SEEK IMMEDIATE MEDICAL CARE IF:   You have sudden weakness or numbness of the face, arm, or leg, especially on one side of the body.  Your face or eyelid droops to one side.  You have sudden confusion.  You have trouble speaking (aphasia) or understanding.  You have sudden trouble seeing in one or both eyes.  You have sudden trouble walking.  You have dizziness.  You have a loss of balance or coordination.  You have a sudden, severe headache with no known cause.  You have new chest pain or an irregular heartbeat. Any of these symptoms may represent a serious problem that is an emergency. Do not wait to see if the symptoms will go away. Get medical help at once. Call your local emergency services (911 in [place] Do not drive yourself to the hospital. Document Released: 04/19/2004 Document Revised: 07/27/2013 Document Reviewed: 09/12/2012 [employer] Patient Information 2015 [employer]. This information is not intended to replace advice given   to you by your health care provider. Make sure you discuss any questions you have with your health care provider.     Peripheral Vascular Disease Peripheral Vascular Disease (PVD), also called Peripheral Arterial Disease (PAD), is a circulation problem caused by cholesterol (atherosclerotic plaque) deposits in the  arteries. PVD commonly occurs in the lower extremities (legs) but it can occur in other areas of the body, such as your arms. The cholesterol buildup in the arteries reduces blood flow which can cause pain and other serious problems. The presence of PVD can place a person at risk for Coronary Artery Disease (CAD).  CAUSES  Causes of PVD can be many. It is usually associated with more than one risk factor such as:   High Cholesterol.  Smoking.  Diabetes.  Lack of exercise or inactivity.  High blood pressure (hypertension).  Obesity.  Family history. SYMPTOMS   When the lower extremities are affected, patients with PVD may experience:  Leg pain with exertion or physical activity. This is called INTERMITTENT CLAUDICATION. This may present as cramping or numbness with physical activity. The location of the pain is associated with the level of blockage. For example, blockage at the abdominal level (distal abdominal aorta) may result in buttock or hip pain. Lower leg arterial blockage may result in calf pain.  As PVD becomes more severe, pain can develop with less physical activity.  In people with severe PVD, leg pain may occur at rest.  Other PVD signs and symptoms:  Leg numbness or weakness.  Coldness in the affected leg or foot, especially when compared to the other leg.  A change in leg color.  Patients with significant PVD are more prone to ulcers or sores on toes, feet or legs. These may take longer to heal or may reoccur. The ulcers or sores can become infected.  If signs and symptoms of PVD are ignored, gangrene may occur. This can result in the loss of toes or loss of an entire limb.  Not all leg pain is related to PVD. Other medical conditions can cause leg pain such as:  Blood clots (embolism) or Deep Vein Thrombosis.  Inflammation of the blood vessels (vasculitis).  Spinal stenosis. DIAGNOSIS  Diagnosis of PVD can involve several different types of tests. These  can include:  Pulse Volume Recording Method (PVR). This test is simple, painless and does not involve the use of X-rays. PVR involves measuring and comparing the blood pressure in the arms and legs. An ABI (Ankle-Brachial Index) is calculated. The normal ratio of blood pressures is 1. As this number becomes smaller, it indicates more severe disease.  < 0.95 - indicates significant narrowing in one or more leg vessels.  <0.8 - there will usually be pain in the foot, leg or buttock with exercise.  <0.4 - will usually have pain in the legs at rest.  <0.25 - usually indicates limb threatening PVD.  Doppler detection of pulses in the legs. This test is painless and checks to see if you have a pulses in your legs/feet.  A dye or contrast material (a substance that highlights the blood vessels so they show up on x-ray) may be given to help your caregiver better see the arteries for the following tests. The dye is eliminated from your body by the kidney's. Your caregiver may order blood work to check your kidney function and other laboratory values before the following tests are performed:  Magnetic Resonance Angiography (MRA). An MRA is a picture study of the   blood vessels and arteries. The MRA machine uses a large magnet to produce images of the blood vessels.  Computed Tomography Angiography (CTA). A CTA is a specialized x-ray that looks at how the blood flows in your blood vessels. An IV may be inserted into your arm so contrast dye can be injected.  Angiogram. Is a procedure that uses x-rays to look at your blood vessels. This procedure is minimally invasive, meaning a small incision (cut) is made in your groin. A small tube (catheter) is then inserted into the artery of your groin. The catheter is guided to the blood vessel or artery your caregiver wants to examine. Contrast dye is injected into the catheter. X-rays are then taken of the blood vessel or artery. After the images are obtained, the  catheter is taken out. TREATMENT  Treatment of PVD involves many interventions which may include:  Lifestyle changes:  Quitting smoking.  Exercise.  Following a low fat, low cholesterol diet.  Control of diabetes.  Foot care is very important to the PVD patient. Good foot care can help prevent infection.  Medication:  Cholesterol-lowering medicine.  Blood pressure medicine.  Anti-platelet drugs.  Certain medicines may reduce symptoms of Intermittent Claudication.  Interventional/Surgical options:  Angioplasty. An Angioplasty is a procedure that inflates a balloon in the blocked artery. This opens the blocked artery to improve blood flow.  Stent Implant. A wire mesh tube (stent) is placed in the artery. The stent expands and stays in place, allowing the artery to remain open.  Peripheral Bypass Surgery. This is a surgical procedure that reroutes the blood around a blocked artery to help improve blood flow. This type of procedure may be performed if Angioplasty or stent implants are not an option. SEEK IMMEDIATE MEDICAL CARE IF:   You develop pain or numbness in your arms or legs.  Your arm or leg turns cold, becomes blue in color.  You develop redness, warmth, swelling and pain in your arms or legs. MAKE SURE YOU:   Understand these instructions.  Will watch your condition.  Will get help right away if you are not doing well or get worse. Document Released: 04/19/2004 Document Revised: 06/04/2011 Document Reviewed: 03/16/2008 Exit[employer] Patient Information 2015 [employer]. This information is not intended to replace advice given to you by your health care provider. Make sure you discuss any questions you have with your health care provider.    Smoking Cessation Quitting smoking is important to your health and has many advantages. However, it is not always easy to quit since nicotine is a very addictive drug. Oftentimes, people try 3 times or more before being  able to quit. This document explains the best ways for you to prepare to quit smoking. Quitting takes hard work and a lot of effort, but you can do it. ADVANTAGES OF QUITTING SMOKING  You will live longer, feel better, and live better.  Your body will feel the impact of quitting smoking almost immediately.  Within 20 minutes, blood pressure decreases. Your pulse returns to its normal level.  After 8 hours, carbon monoxide levels in the blood return to normal. Your oxygen level increases.  After 24 hours, the chance of having a heart attack starts to decrease. Your breath, hair, and body stop smelling like smoke.  After 48 hours, damaged nerve endings begin to recover. Your sense of taste and smell improve.  After 72 hours, the body is virtually free of nicotine. Your bronchial tubes relax and breathing becomes easier.    After 2 to 12 weeks, lungs can hold more air. Exercise becomes easier and circulation improves.  The risk of having a heart attack, stroke, cancer, or lung disease is greatly reduced.  After 1 year, the risk of coronary heart disease is cut in half.  After 5 years, the risk of stroke falls to the same as a nonsmoker.  After 10 years, the risk of lung cancer is cut in half and the risk of other cancers decreases significantly.  After 15 years, the risk of coronary heart disease drops, usually to the level of a nonsmoker.  If you are pregnant, quitting smoking will improve your chances of having a healthy baby.  The people you live with, especially any children, will be healthier.  You will have extra money to spend on things other than cigarettes. QUESTIONS TO THINK ABOUT BEFORE ATTEMPTING TO QUIT You may want to talk about your answers with your health care provider.  Why do you want to quit?  If you tried to quit in the past, what helped and what did not?  What will be the most difficult situations for you after you quit? How will you plan to handle  them?  Who can help you through the tough times? Your family? Friends? A health care provider?  What pleasures do you get from smoking? What ways can you still get pleasure if you quit? Here are some questions to ask your health care provider:  How can you help me to be successful at quitting?  What medicine do you think would be best for me and how should I take it?  What should I do if I need more help?  What is smoking withdrawal like? How can I get information on withdrawal? GET READY  Set a quit date.  Change your environment by getting rid of all cigarettes, ashtrays, matches, and lighters in your home, car, or work. Do not let people smoke in your home.  Review your past attempts to quit. Think about what worked and what did not. GET SUPPORT AND ENCOURAGEMENT You have a better chance of being successful if you have help. You can get support in many ways.  Tell your family, friends, and coworkers that you are going to quit and need their support. Ask them not to smoke around you.  Get individual, group, or telephone counseling and support. Programs are available at local hospitals and health centers. Call your local health department for information about programs in your area.  Spiritual beliefs and practices may help some smokers quit.  Download a "quit meter" on your computer to keep track of quit statistics, such as how long you have gone without smoking, cigarettes not smoked, and money saved.  Get a self-help book about quitting smoking and staying off tobacco. LEARN NEW SKILLS AND BEHAVIORS  Distract yourself from urges to smoke. Talk to someone, go for a walk, or occupy your time with a task.  Change your normal routine. Take a different route to work. Drink tea instead of coffee. Eat breakfast in a different place.  Reduce your stress. Take a hot bath, exercise, or read a book.  Plan something enjoyable to do every day. Reward yourself for not  smoking.  Explore interactive web-based programs that specialize in helping you quit. GET MEDICINE AND USE IT CORRECTLY Medicines can help you stop smoking and decrease the urge to smoke. Combining medicine with the above behavioral methods and support can greatly increase your chances of successfully quitting smoking.    Nicotine replacement therapy helps deliver nicotine to your body without the negative effects and risks of smoking. Nicotine replacement therapy includes nicotine gum, lozenges, inhalers, nasal sprays, and skin patches. Some may be available over-the-counter and others require a prescription.  Antidepressant medicine helps people abstain from smoking, but how this works is unknown. This medicine is available by prescription.  Nicotinic receptor partial agonist medicine simulates the effect of nicotine in your brain. This medicine is available by prescription. Ask your health care provider for advice about which medicines to use and how to use them based on your health history. Your health care provider will tell you what side effects to look out for if you choose to be on a medicine or therapy. Carefully read the information on the package. Do not use any other product containing nicotine while using a nicotine replacement product.  RELAPSE OR DIFFICULT SITUATIONS Most relapses occur within the first 3 months after quitting. Do not be discouraged if you start smoking again. Remember, most people try several times before finally quitting. You may have symptoms of withdrawal because your body is used to nicotine. You may crave cigarettes, be irritable, feel very hungry, cough often, get headaches, or have difficulty concentrating. The withdrawal symptoms are only temporary. They are strongest when you first quit, but they will go away within 10-14 days. To reduce the chances of relapse, try to:  Avoid drinking alcohol. Drinking lowers your chances of successfully quitting.  Reduce the  amount of caffeine you consume. Once you quit smoking, the amount of caffeine in your body increases and can give you symptoms, such as a rapid heartbeat, sweating, and anxiety.  Avoid smokers because they can make you want to smoke.  Do not let weight gain distract you. Many smokers will gain weight when they quit, usually less than 10 pounds. Eat a healthy diet and stay active. You can always lose the weight gained after you quit.  Find ways to improve your mood other than smoking. FOR MORE INFORMATION  www.smokefree.gov  Document Released: 03/06/2001 Document Revised: 07/27/2013 Document Reviewed: 06/21/2011 [employer] Patient Information 2015 [employer]. This information is not intended to replace advice given to you by your health care provider. Make sure you discuss any questions you have with your health care provider.    Smoking Cessation, Tips for Success If you are ready to quit smoking, congratulations! You have chosen to help yourself be healthier. Cigarettes bring nicotine, tar, carbon monoxide, and other irritants into your body. Your lungs, heart, and blood vessels will be able to work better without these poisons. There are many different ways to quit smoking. Nicotine gum, nicotine patches, a nicotine inhaler, or nicotine nasal spray can help with physical craving. Hypnosis, support groups, and medicines help break the habit of smoking. WHAT THINGS CAN I DO TO MAKE QUITTING EASIER?  Here are some tips to help you quit for good:  Pick a date when you will quit smoking completely. Tell all of your friends and family about your plan to quit on that date.  Do not try to slowly cut down on the number of cigarettes you are smoking. Pick a quit date and quit smoking completely starting on that day.  Throw away all cigarettes.   Clean and remove all ashtrays from your home, work, and car.  On a card, write down your reasons for quitting. Carry the card with you and read it  when you get the urge to smoke.  Cleanse your body   of nicotine. Drink enough water and fluids to keep your urine clear or pale yellow. Do this after quitting to flush the nicotine from your body.  Learn to predict your moods. Do not let a bad situation be your excuse to have a cigarette. Some situations in your life might tempt you into wanting a cigarette.  Never have "just one" cigarette. It leads to wanting another and another. Remind yourself of your decision to quit.  Change habits associated with smoking. If you smoked while driving or when feeling stressed, try other activities to replace smoking. Stand up when drinking your coffee. Brush your teeth after eating. Sit in a different chair when you read the paper. Avoid alcohol while trying to quit, and try to drink fewer caffeinated beverages. Alcohol and caffeine may urge you to smoke.  Avoid foods and drinks that can trigger a desire to smoke, such as sugary or spicy foods and alcohol.  Ask people who smoke not to smoke around you.  Have something planned to do right after eating or having a cup of coffee. For example, plan to take a walk or exercise.  Try a relaxation exercise to calm you down and decrease your stress. Remember, you may be tense and nervous for the first 2 weeks after you quit, but this will pass.  Find new activities to keep your hands busy. Play with a pen, coin, or rubber band. Doodle or draw things on paper.  Brush your teeth right after eating. This will help cut down on the craving for the taste of tobacco after meals. You can also try mouthwash.   Use oral substitutes in place of cigarettes. Try using lemon drops, carrots, cinnamon sticks, or chewing gum. Keep them handy so they are available when you have the urge to smoke.  When you have the urge to smoke, try deep breathing.  Designate your home as a nonsmoking area.  If you are a heavy smoker, ask your health care provider about a prescription for  nicotine chewing gum. It can ease your withdrawal from nicotine.  Reward yourself. Set aside the cigarette money you save and buy yourself something nice.  Look for support from others. Join a support group or smoking cessation program. Ask someone at home or at work to help you with your plan to quit smoking.  Always ask yourself, "Do I need this cigarette or is this just a reflex?" Tell yourself, "Today, I choose not to smoke," or "I do not want to smoke." You are reminding yourself of your decision to quit.  Do not replace cigarette smoking with electronic cigarettes (commonly called e-cigarettes). The safety of e-cigarettes is unknown, and some may contain harmful chemicals.  If you relapse, do not give up! Plan ahead and think about what you will do the next time you get the urge to smoke. HOW WILL I FEEL WHEN I QUIT SMOKING? You may have symptoms of withdrawal because your body is used to nicotine (the addictive substance in cigarettes). You may crave cigarettes, be irritable, feel very hungry, cough often, get headaches, or have difficulty concentrating. The withdrawal symptoms are only temporary. They are strongest when you first quit but will go away within 10-14 days. When withdrawal symptoms occur, stay in control. Think about your reasons for quitting. Remind yourself that these are signs that your body is healing and getting used to being without cigarettes. Remember that withdrawal symptoms are easier to treat than the major diseases that smoking can cause.    Even after the withdrawal is over, expect periodic urges to smoke. However, these cravings are generally short lived and will go away whether you smoke or not. Do not smoke! WHAT RESOURCES ARE AVAILABLE TO HELP ME QUIT SMOKING? Your health care provider can direct you to community resources or hospitals for support, which may include:  Group support.  Education.  Hypnosis.  Therapy. Document Released: 12/09/2003 Document  Revised: 07/27/2013 Document Reviewed: 08/28/2012 [employer] Patient Information 2015 [employer]. This information is not intended to replace advice given to you by your health care provider. Make sure you discuss any questions you have with your health care provider.  

## 2014-11-10 NOTE — Progress Notes (Signed)
[facility] [place] HISTORY AND PHYSICAL   MRN : [id number]  History of Present Illness:   Barry Shepherd is a 69 y.o. male patient of [name] who returns today for follow up of known bilateral carotid disease and peripheral vascular disease.  He has known left SFA occlusion.   With respect to his carotid disease, he denies any history of stroke, TIAs, expressive or receptive aphasia, or amaurosis fugax.   He denies nonhealing ulcers. 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 He reports he had pain in the left buttocks after walking about 100-200 feet, relieved with rest; but he states that he has been diagnosed with bilateral hip arthritis and lumbar spine issues. He has no pain in his right hip/leg.  Patient has not had previous carotid artery intervention.  Patient reports New Medical or Surgical History: had a stress test February 2016 in [place], [place], pt does not recall cardiologist name, states result was fine. Also states his COPD testing was good recently.  Pt Diabetic: Yes, self reported home glucose 117-200, sounds almost in control Pt smoker: smoker (1 ppd, started at age 36 yrs)  Pt meds include: Statin : No ASA: Yes Other anticoagulants/antiplatelets: no   Current Outpatient Prescriptions  Medication Sig Dispense Refill  . ACCU-CHEK AVIVA PLUS test strip     . ACCU-CHEK SOFTCLIX LANCETS lancets     . aspirin 81 MG tablet Take 81 mg by mouth every morning.    . Blood Glucose Calibration (ACCU-CHEK ACTIVE GLUCOSE CONT VI) CHECK GLUCOSE DAILY    . Blood Glucose Monitoring Suppl (FIFTY50 GLUCOSE METER 2.0) W/DEVICE KIT 1 each by Other route once. Use as instructed    . glipiZIDE (GLUCOTROL XL) 2.5 MG 24 hr tablet 2.5 mg daily with breakfast.     . glipiZIDE (GLUCOTROL) 10 MG tablet Take 10 mg by mouth  2 (two) times daily before a meal.    . LEVEMIR FLEXTOUCH 100 UNIT/ML Pen at bedtime.    Marland Kitchen lisinopril (PRINIVIL,ZESTRIL) 40 MG tablet Take 40 mg by mouth daily.    . meloxicam (MOBIC) 15 MG tablet Take 15 mg by mouth daily.    . meloxicam (MOBIC) 7.5 MG tablet at bedtime.    . metFORMIN (GLUCOPHAGE) 1000 MG tablet Take 1,000 mg by mouth 2 (two) times daily with a meal.    . metoprolol succinate (TOPROL-XL) 25 MG 24 hr tablet Take 25 mg by mouth daily.    . Omega-3 Fatty Acids (FISH OIL) 1000 MG CAPS Take by mouth.    . Tamsulosin HCl (FLOMAX) 0.4 MG CAPS Take 0.4 mg by mouth daily.    Marland Kitchen VICTOZA 18 MG/3ML SOPN every morning.     No current facility-administered medications for this visit.    Past Medical History  Diagnosis Date  . Diabetes mellitus without complication   . Carotid artery occlusion   . Hypertension   . Hyperlipidemia     Social History Social History  Substance Use Topics  . Smoking status: Current Every Day Smoker -- 1.50 packs/day for 50 years    Types: Cigarettes  . Smokeless tobacco: Never Used  . Alcohol Use: No    Family History Family History  Problem Relation Age of Onset  . Heart disease Father     before age 70  . Hyperlipidemia Father   .  Hypertension Father   . Heart attack Father   . Heart disease Mother   . Hypertension Mother   . Heart disease Brother     After age 52    Surgical History Past Surgical History  Procedure Laterality Date  . Cholecystectomy    . Cardiovascular stress test  Oct. 2015    No Known Allergies  Current Outpatient Prescriptions  Medication Sig Dispense Refill  . ACCU-CHEK AVIVA PLUS test strip     . ACCU-CHEK SOFTCLIX LANCETS lancets     . aspirin 81 MG tablet Take 81 mg by mouth every morning.    . Blood Glucose Calibration (ACCU-CHEK ACTIVE GLUCOSE CONT VI) CHECK GLUCOSE DAILY    . Blood Glucose Monitoring Suppl (FIFTY50 GLUCOSE METER 2.0) W/DEVICE KIT 1 each by Other route once. Use as instructed     . glipiZIDE (GLUCOTROL XL) 2.5 MG 24 hr tablet 2.5 mg daily with breakfast.     . glipiZIDE (GLUCOTROL) 10 MG tablet Take 10 mg by mouth 2 (two) times daily before a meal.    . LEVEMIR FLEXTOUCH 100 UNIT/ML Pen at bedtime.    Marland Kitchen lisinopril (PRINIVIL,ZESTRIL) 40 MG tablet Take 40 mg by mouth daily.    . meloxicam (MOBIC) 15 MG tablet Take 15 mg by mouth daily.    . meloxicam (MOBIC) 7.5 MG tablet at bedtime.    . metFORMIN (GLUCOPHAGE) 1000 MG tablet Take 1,000 mg by mouth 2 (two) times daily with a meal.    . metoprolol succinate (TOPROL-XL) 25 MG 24 hr tablet Take 25 mg by mouth daily.    . Omega-3 Fatty Acids (FISH OIL) 1000 MG CAPS Take by mouth.    . Tamsulosin HCl (FLOMAX) 0.4 MG CAPS Take 0.4 mg by mouth daily.    Marland Kitchen VICTOZA 18 MG/3ML SOPN every morning.     No current facility-administered medications for this visit.     REVIEW OF SYSTEMS: See HPI for pertinent positives and negatives.  Physical Examination Filed Vitals:   11/10/14 1101 11/10/14 1103  BP: 177/79 168/77  Pulse: 65   Height: 5' 6"  (1.676 m)   Weight: 203 lb 6.4 oz (92.262 kg)   SpO2: 96%    Body mass index is 32.85 kg/(m^2).  General: WDWN obese male in NAD GAIT: normal Eyes: PERRLA Pulmonary: Non-labored, limited air movement in all fields, Negative Rales, Negative rhonchi, & Negative wheezing.  Cardiac: regular Rhythm, positive murmur.  VASCULAR EXAM Carotid Bruits Left Right   Transmitted cardiac murmur Transmitted cardiac murmur   Aorta is not palpable. Radial pulses are 2+ palpable and equal.      LE Pulses LEFT RIGHT   FEMORAL 1+ palpable 2+ palpable    POPLITEAL not palpable  not palpable   POSTERIOR TIBIAL not palpable   1+palpable    DORSALIS PEDIS  ANTERIOR TIBIAL 1+  palpable  not palpable     Gastrointestinal: soft, nontender, BS WNL, no r/g, no palpated masses, large panus.  Musculoskeletal: Negative muscle atrophy/wasting. M/S 5/5 throughout, Extremities without ischemic changes  Neurologic: A&O X 3; Appropriate Affect, Speech is normal CN 2-12 intact, Pain and light touch intact in extremities, Motor exam as listed above.              Non-Invasive Vascular Imaging (11/10/2014):  CEREBROVASCULAR DUPLEX EVALUATION    INDICATION: Carotid artery disease    PREVIOUS INTERVENTION(S): None    DUPLEX EXAM: Carotid duplex    RIGHT  LEFT  Peak Systolic Velocities (cm/s) End  Diastolic Velocities (cm/s) Plaque LOCATION Peak Systolic Velocities (cm/s) End Diastolic Velocities (cm/s) Plaque  156 13 - CCA PROXIMAL 120 16 HT  84 11 - CCA MID 101 17 HT  62 14 - CCA DISTAL 78 13 -  181 18 - ECA 116 12 -  367 116 HT ICA PROXIMAL 245 78 CP  127 29 - ICA MID 206 37 -  106 21 - ICA DISTAL 200 49 -    4.3 ICA / CCA Ratio (PSV) 2.4  Antegrade Vertebral Flow Antegrade  185 Brachial Systolic Pressure (mmHg) 631  Triphasic Brachial Artery Waveforms Triphasic    Plaque Morphology:  HM = Homogeneous, HT = Heterogeneous, CP = Calcific Plaque, SP = Smooth Plaque, IP = Irregular Plaque     ADDITIONAL FINDINGS:     IMPRESSION: 1. 80 - 99% right internal carotid artery stenosis. 2. 60 - 79% left internal carotid artery stenosis, calcific plaque may obscure higher velocity    Compared to the previous exam:  Increased velocity bilaterally    ABI (Date: 11/10/2014)  R: 1.0 (1.01, 07/28/14), DP: biphasic, PT: triphasic, TBI: 0.66  L: 0.80 (0.77), DP: monophasic, PT: monophasic, TBI: 0.66    ASSESSMENT:  Barry Shepherd is a 69 y.o. male who has bilateral ICA stenoses, and mild PAD in his left LE with a known left SFA occlusion. The pain in his left buttock and leg is not c/w PAD but is c/w his known lumbar spine/left radiculopathy issues. He  has been seen in a pain management clinic for this. He has no right LE pain. He has no non healing wounds in his feet or lower legs. He has no history of stroke or TIA. Today's carotid Duplex suggests 80 - 99% right internal carotid artery stenosis and 60 - 79% left internal carotid artery stenosis, calcific plaque may obscure higher velocity. Increased velocity bilaterally.  ABI's remain stable.  His diabetes seems to be under better control, but unfortunately he continues to smoke a ppd, which is a decrease in use for him. Pt reports a February 2016 cardiac stress test as being normal; he reports what sounds like recent pulmonary function tests as being close to normal.  [name] spoke with pt.  Face to face time with patient was 25 minutes. Over 50% of this time was spent on counseling and coordination of care.   PLAN:   The patient was counseled re smoking cessation and given several free resources re smoking cessation.  Based on today's exam and non-invasive vascular lab results, the patient will be scheduled for right CEA on 12/02/14 by [name] I discussed in depth with the patient the nature of atherosclerosis, and emphasized the importance of maximal medical management including strict control of blood pressure, blood glucose, and lipid levels, obtaining regular exercise, and cessation of smoking.  The patient is aware that without maximal medical management the underlying atherosclerotic disease process will progress, limiting the benefit of any interventions.  The patient was given information about stroke prevention and what symptoms should prompt the patient to seek immediate medical care.  The patient was given information about PAD including signs, symptoms, treatment, what symptoms should prompt the patient to seek immediate medical care, and risk reduction measures to take. Thank you for allowing [place] to participate in this patient's care.  Clemon Chambers, RN, MSN,  FNP-C [facility] Office: [phone]  Clinic MD: Scot Dock  11/10/2014 11:16 AM

## 2014-11-23 NOTE — Pre-Procedure Instructions (Signed)
Barry Shepherd  11/23/2014    Your procedure is scheduled on Thursday, September 8.  Report to [facility] Admitting at 5:30 A.M.               Your surgery is scheduled for 7:30 A.M.   Call this number if you have problems the morning of surgery:[phone]                For any other questions, please call [phone], Monday - Friday 8 AM - 4 PM.   Remember:  Do not eat food or drink liquids after midnight Wednesday, September 7.  Take these medicines the morning of surgery with A SIP OF WATER : amLODipine (NORVASC).                Stop taking meloxicam (MOBIC), Vitamins, Herbal medications (Fish Oil) on September 1.             Stop Aspirin as directed by Dr Arbie Cookey.                       What do I do about my diabetes medications?  Do not take oral diabetes medicines (pills) the morning of surgery.    THE NIGHT BEFORE SURGERY, take 8 units of Levemir Insulin.   THE MORNING OF SURGERY, take 0 units of Victoza                                                                                                                Do not take other diabetes injectables the day of surgery including Byetta, Victoza, Bydureon, and Trulicity.    How to Manage Your Diabetes Before Surgery   Why is it important to control my blood sugar before and after surgery?   Improving blood sugar levels before and after surgery helps healing and can limit problems.  A way of improving blood sugar control is eating a healthy diet by:  - Eating less sugar and carbohydrates  - Increasing activity/exercise  - Talk with your doctor about reaching your blood sugar goals  High blood sugars (greater than 180 mg/dL) can raise your risk of infections and slow down your recovery so you will need to focus on controlling your diabetes during the weeks before surgery.  Make sure that the doctor who takes care of your diabetes knows about your planned surgery including the date and  location.  How do I manage my blood sugars before surgery?   Check your blood sugar at least 4 times a day, 2 days before surgery to make sure that they are not too high or low.   Check your blood sugar the morning of your surgery when you wake up and every 2 hours until you get to the Short-Stay unit.  If your blood sugar is less than 70 mg/dL, you will need to treat for low blood sugar by:  Treat a low blood sugar (less than 70 mg/dL) with 1/2 cup of clear juice (cranberry or apple),  4 glucose tablets, OR glucose gel.  Recheck blood sugar in 15 minutes after treatment (to make sure it is greater than 70 mg/dL).  If blood sugar is not greater than 70 mg/dL on re-check, call [phone] for further instructions.   Report your blood sugar to the Short-Stay nurse when you get to Short- Stay.     Do not wear jewelry, make-up or nail polish.  Do not wear lotions, powders, or perfumes.    Men may shave face and neck.  Do not bring valuables to the hospital.  [facility] is not responsible for any belongings or valuables.  Contacts, dentures or bridgework may not be worn into surgery.  Leave your suitcase in the car.  After surgery it may be brought to your room.  For patients admitted to the hospital, discharge time will be determined by your treatment team.   Special instructions:  Review  [facility] - Preparing For Surgery.  Please read over the following fact sheets that you were given. Pain Booklet, Coughing and Deep Breathing, Blood Transfusion Information and Surgical Site Infection Prevention

## 2014-11-24 ENCOUNTER — Other Ambulatory Visit: Payer: Self-pay

## 2014-11-24 ENCOUNTER — Encounter (HOSPITAL_COMMUNITY): Payer: Self-pay

## 2014-11-24 ENCOUNTER — Encounter (HOSPITAL_COMMUNITY)
Admission: RE | Admit: 2014-11-24 | Discharge: 2014-11-24 | Disposition: A | Discharge status: Home / Self Care | Payer: Medicare PPO | Source: Ambulatory Visit | Attending: Vascular Surgery | Admitting: Vascular Surgery

## 2014-11-24 DIAGNOSIS — I10 Essential (primary) hypertension: Secondary | ICD-10-CM | POA: Insufficient documentation

## 2014-11-24 DIAGNOSIS — E119 Type 2 diabetes mellitus without complications: Secondary | ICD-10-CM | POA: Insufficient documentation

## 2014-11-24 DIAGNOSIS — Z01812 Encounter for preprocedural laboratory examination: Secondary | ICD-10-CM | POA: Insufficient documentation

## 2014-11-24 DIAGNOSIS — E785 Hyperlipidemia, unspecified: Secondary | ICD-10-CM | POA: Diagnosis not present

## 2014-11-24 DIAGNOSIS — I6529 Occlusion and stenosis of unspecified carotid artery: Secondary | ICD-10-CM | POA: Diagnosis not present

## 2014-11-24 DIAGNOSIS — Z01818 Encounter for other preprocedural examination: Secondary | ICD-10-CM | POA: Diagnosis present

## 2014-11-24 DIAGNOSIS — Z0183 Encounter for blood typing: Secondary | ICD-10-CM | POA: Diagnosis not present

## 2014-11-24 DIAGNOSIS — F17200 Nicotine dependence, unspecified, uncomplicated: Secondary | ICD-10-CM | POA: Insufficient documentation

## 2014-11-24 LAB — COMPREHENSIVE METABOLIC PANEL
ALK PHOS: 73 U/L (ref 38–126)
ALT: 50 U/L (ref 17–63)
ANION GAP: 8 (ref 5–15)
AST: 39 U/L (ref 15–41)
Albumin: 3.9 g/dL (ref 3.5–5.0)
BILIRUBIN TOTAL: 0.4 mg/dL (ref 0.3–1.2)
BUN: 11 mg/dL (ref 6–20)
CALCIUM: 9.6 mg/dL (ref 8.9–10.3)
CO2: 28 mmol/L (ref 22–32)
CREATININE: 0.94 mg/dL (ref 0.61–1.24)
Chloride: 103 mmol/L (ref 101–111)
GFR calc non Af Amer: >60 mL/min (ref >60)
GLUCOSE: 263 mg/dL — AB (ref 65–99)
Potassium: 4.6 mmol/L (ref 3.5–5.1)
Sodium: 139 mmol/L (ref 135–145)
TOTAL PROTEIN: 6.7 g/dL (ref 6.5–8.1)

## 2014-11-24 LAB — URINALYSIS, ROUTINE W REFLEX MICROSCOPIC
Bilirubin Urine: NEGATIVE
Glucose, UA: >1000 mg/dL — AB
HGB URINE DIPSTICK: NEGATIVE
Ketones, ur: NEGATIVE mg/dL
LEUKOCYTES UA: NEGATIVE
Nitrite: NEGATIVE
PROTEIN: 100 mg/dL — AB
SPECIFIC GRAVITY, URINE: 1.046 — AB (ref 1.005–1.030)
UROBILINOGEN UA: 0.2 mg/dL (ref 0.0–1.0)
pH: 5 (ref 5.0–8.0)

## 2014-11-24 LAB — CBC
HEMATOCRIT: 45.7 % (ref 39.0–52.0)
HEMOGLOBIN: 15 g/dL (ref 13.0–17.0)
MCH: 27.2 pg (ref 26.0–34.0)
MCHC: 32.8 g/dL (ref 30.0–36.0)
MCV: 82.8 fL (ref 78.0–100.0)
Platelets: 159 10*3/uL (ref 150–400)
RBC: 5.52 MIL/uL (ref 4.22–5.81)
RDW: 16.3 % — ABNORMAL HIGH (ref 11.5–15.5)
WBC: 7.7 10*3/uL (ref 4.0–10.5)

## 2014-11-24 LAB — TYPE AND SCREEN
ABO/RH(D): O POS
Antibody Screen: NEGATIVE

## 2014-11-24 LAB — PROTIME-INR
INR: 0.91 (ref 0.00–1.49)
Prothrombin Time: 12.5 seconds (ref 11.6–15.2)

## 2014-11-24 LAB — GLUCOSE, CAPILLARY: Glucose-Capillary: 245 mg/dL — ABNORMAL HIGH (ref 65–99)

## 2014-11-24 LAB — URINE MICROSCOPIC-ADD ON

## 2014-11-24 LAB — SURGICAL PCR SCREEN
MRSA, PCR: NEGATIVE
STAPHYLOCOCCUS AUREUS: POSITIVE — AB

## 2014-11-24 LAB — APTT: aPTT: 27 seconds (ref 24–37)

## 2014-11-24 LAB — ABO/RH: ABO/RH(D): O POS

## 2014-11-24 NOTE — Progress Notes (Signed)
Had to leave VM at pharmacy for bactroban ointment.

## 2014-11-24 NOTE — Pre-Procedure Instructions (Signed)
Barry Shepherd  11/24/2014      [facility], [place], [place] - [address] [place] [address] Phone: [phone] Fax: [phone]  [facility] - [place],  - [address] [place] [address] Phone: [phone] Fax: [phone]    Your procedure is scheduled on December 02, 2014.  Report to [facility] Admitting at 5:30 A.M.  Call this number if you have problems the morning of surgery:  [phone]   Remember:  Do not eat food or drink liquids after midnight.  Take these medicines the morning of surgery with A SIP OF WATER : amLODipine (NORVASC)  STOP  meloxicam (MOBIC), NSAIDS- ADVIL, ALEVE, IBUPROFEN, HERBAL MEDICATIONS ONE WEEK PRIOR TO SURGERY   Do not wear jewelry.  Do not wear lotions, powders, or colognes.  You may NOT wear deodorant.  Men may shave face and neck.  Do not bring valuables to the hospital.  [facility] is not responsible for any belongings or valuables.  Contacts, dentures or bridgework may not be worn into surgery.  Leave your suitcase in the car.  After surgery it may be brought to your room.  For patients admitted to the hospital, discharge time will be determined by your treatment team.     Name and phone number of your driver:    Special instructions:  "Preparing for Surgery"  Please read over the following fact sheets that you were given. Pain Booklet, Coughing and Deep Breathing, Blood Transfusion Information and Surgical Site Infection Prevention        How to Manage Your Diabetes Before Surgery   Why is it important to control my blood sugar before and after surgery?   Improving blood sugar levels before and after surgery helps healing and can limit problems.  A way of improving blood sugar control is eating a healthy diet by:  - Eating less sugar and carbohydrates  - Increasing activity/exercise  - Talk with your doctor about reaching your blood sugar  goals  High blood sugars (greater than 180 mg/dL) can raise your risk of infections and slow down your recovery so you will need to focus on controlling your diabetes during the weeks before surgery.  Make sure that the doctor who takes care of your diabetes knows about your planned surgery including the date and location.  How do I manage my blood sugars before surgery?   Check your blood sugar at least 4 times a day, 2 days before surgery to make sure that they are not too high or low.   Check your blood sugar the morning of your surgery when you wake up and every 2               hours until you get to the Short-Stay unit.  If your blood sugar is less than 70 mg/dL, you will need to treat for low blood sugar by:  Treat a low blood sugar (less than 70 mg/dL) with 1/2 cup of clear juice (cranberry or apple), 4 glucose tablets, OR glucose gel.  Recheck blood sugar in 15 minutes after treatment (to make sure it is greater than 70 mg/dL).  If blood sugar is not greater than 70 mg/dL on re-check, call [phone] for further instructions.   Report your blood sugar to the Short-Stay nurse when you get to Short-Stay.  References:  [facility], 2007 "How to Manage your Diabetes Before and After  Surgery".  What do I do about my diabetes medications?   Do not take oral diabetes medicines (pills) the morning of surgery.  THE NIGHT BEFORE SURGERY, take 8 units of  levemir insulin.    Do not take other diabetes injectables the day of surgery including Byetta, Victoza, Bydureon, and Trulicity.

## 2014-11-24 NOTE — Progress Notes (Signed)
This patient scored at an elevated risk for obstructive sleep apnea using the STOP BANG TOOL during a pre surgical testing 

## 2014-11-25 LAB — HEMOGLOBIN A1C
HEMOGLOBIN A1C: 10.5 % — AB (ref 4.8–5.6)
Mean Plasma Glucose: 255 mg/dL

## 2014-11-25 NOTE — Progress Notes (Signed)
Anesthesia Chart Review:  Pt is 69 year old male scheduled for R CEA on 12/02/2014 with Dr. Edilia Bo.   PMH includes: HTN, carotid artery stenosis, DM, hyperlipidemia. Current smoker. BMI 32.   Medications include: amlodipine, ASA, glipizide, levemir, metformin, victoza.  Preoperative labs reviewed.  HgbA1c 10.5, glucose 263.   EKG 11/24/2014: NSR. Nonspecific T wave abnormality.   Carotid duplex US 11/10/2014:  1. 80-99% R ICA stenosis 2. 60-79% L ICA stenosis, calcific plaque may obscure higher velocity  Echo 06/07/2009: -Normal global and regional LV systolic function, EF 60% -Mildly sclerotic aortic valve  Spoke with pt by telephone. Fasting blood sugar this morning was 192. Explained to pt if glucose greater than 200 DOS, there is a good chance his surgery would be cancelled. Encouraged medication adherence and tight dietary control of blood sugars.   If glucose acceptable DOS, I anticipate pt can proceed as scheduled.   Rica Mast, FNP-BC [facility] Short Stay Surgical Center/Anesthesiology Phone: [phone] 11/25/2014 2:57 PM

## 2014-12-01 NOTE — Progress Notes (Signed)
Left message for patient to arrive at 630am 12/02/14.

## 2014-12-01 NOTE — Anesthesia Preprocedure Evaluation (Addendum)
Anesthesia Evaluation  Patient identified by MRN, date of birth, ID band Patient awake    Reviewed: Allergy & Precautions, NPO status , Patient's Chart, lab work & pertinent test results  Airway Mallampati: II  TM Distance: >3 FB Neck ROM: Full    Dental  (+) Dental Advisory Given   Pulmonary Current Smoker,    breath sounds clear to auscultation       Cardiovascular hypertension, Pt. on medications + Peripheral Vascular Disease   Rhythm:Regular Rate:Normal     Neuro/Psych negative neurological ROS     GI/Hepatic negative GI ROS, Neg liver ROS,   Endo/Other  diabetes, Type 2, Insulin Dependent, Oral Hypoglycemic Agents  Renal/GU negative Renal ROS     Musculoskeletal   Abdominal   Peds  Hematology negative hematology ROS (+)   Anesthesia Other Findings   Reproductive/Obstetrics                            Lab Results  Component Value Date   WBC 7.7 11/24/2014   HGB 15.0 11/24/2014   HCT 45.7 11/24/2014   MCV 82.8 11/24/2014   PLT 159 11/24/2014   Lab Results  Component Value Date   CREATININE 0.94 11/24/2014   BUN 11 11/24/2014   NA 139 11/24/2014   K 4.6 11/24/2014   CL 103 11/24/2014   CO2 28 11/24/2014    Anesthesia Physical Anesthesia Plan  ASA: III  Anesthesia Plan: General   Post-op Pain Management:    Induction: Intravenous  Airway Management Planned: Oral ETT  Additional Equipment: Arterial line  Intra-op Plan:   Post-operative Plan: Extubation in OR  Informed Consent: I have reviewed the patients History and Physical, chart, labs and discussed the procedure including the risks, benefits and alternatives for the proposed anesthesia with the patient or authorized representative who has indicated his/her understanding and acceptance.   Dental advisory given  Plan Discussed with: CRNA  Anesthesia Plan Comments:        Anesthesia Quick Evaluation

## 2014-12-02 ENCOUNTER — Inpatient Hospital Stay (HOSPITAL_COMMUNITY): Payer: Medicare PPO | Admitting: Anesthesiology

## 2014-12-02 ENCOUNTER — Encounter (HOSPITAL_COMMUNITY): Payer: Self-pay | Admitting: *Deleted

## 2014-12-02 ENCOUNTER — Inpatient Hospital Stay (HOSPITAL_COMMUNITY)
Admission: AD | Admit: 2014-12-02 | Discharge: 2014-12-03 | DRG: 983 | Disposition: A | Discharge status: Home / Self Care | Payer: Medicare PPO | Source: Ambulatory Visit | Attending: Vascular Surgery | Admitting: Vascular Surgery

## 2014-12-02 ENCOUNTER — Inpatient Hospital Stay (HOSPITAL_COMMUNITY): Payer: Medicare PPO

## 2014-12-02 ENCOUNTER — Inpatient Hospital Stay (HOSPITAL_COMMUNITY): Payer: Medicare PPO | Admitting: Emergency Medicine

## 2014-12-02 ENCOUNTER — Encounter (HOSPITAL_COMMUNITY)
Admission: AD | Disposition: A | Discharge status: Home / Self Care | Payer: Self-pay | Source: Ambulatory Visit | Attending: Vascular Surgery

## 2014-12-02 DIAGNOSIS — Z7982 Long term (current) use of aspirin: Secondary | ICD-10-CM

## 2014-12-02 DIAGNOSIS — M13852 Other specified arthritis, left hip: Secondary | ICD-10-CM | POA: Diagnosis present

## 2014-12-02 DIAGNOSIS — I10 Essential (primary) hypertension: Secondary | ICD-10-CM | POA: Diagnosis present

## 2014-12-02 DIAGNOSIS — E669 Obesity, unspecified: Secondary | ICD-10-CM | POA: Diagnosis present

## 2014-12-02 DIAGNOSIS — E785 Hyperlipidemia, unspecified: Secondary | ICD-10-CM | POA: Diagnosis present

## 2014-12-02 DIAGNOSIS — M13851 Other specified arthritis, right hip: Secondary | ICD-10-CM | POA: Diagnosis present

## 2014-12-02 DIAGNOSIS — Z6832 Body mass index (BMI) 32.0-32.9, adult: Secondary | ICD-10-CM

## 2014-12-02 DIAGNOSIS — Z79899 Other long term (current) drug therapy: Secondary | ICD-10-CM

## 2014-12-02 DIAGNOSIS — Z794 Long term (current) use of insulin: Secondary | ICD-10-CM | POA: Diagnosis not present

## 2014-12-02 DIAGNOSIS — M5432 Sciatica, left side: Secondary | ICD-10-CM | POA: Diagnosis present

## 2014-12-02 DIAGNOSIS — I6529 Occlusion and stenosis of unspecified carotid artery: Secondary | ICD-10-CM | POA: Diagnosis present

## 2014-12-02 DIAGNOSIS — F17210 Nicotine dependence, cigarettes, uncomplicated: Secondary | ICD-10-CM | POA: Diagnosis present

## 2014-12-02 DIAGNOSIS — Z8249 Family history of ischemic heart disease and other diseases of the circulatory system: Secondary | ICD-10-CM | POA: Diagnosis not present

## 2014-12-02 DIAGNOSIS — I6523 Occlusion and stenosis of bilateral carotid arteries: Secondary | ICD-10-CM | POA: Diagnosis present

## 2014-12-02 DIAGNOSIS — E119 Type 2 diabetes mellitus without complications: Secondary | ICD-10-CM | POA: Diagnosis present

## 2014-12-02 DIAGNOSIS — I6521 Occlusion and stenosis of right carotid artery: Secondary | ICD-10-CM

## 2014-12-02 DIAGNOSIS — I739 Peripheral vascular disease, unspecified: Secondary | ICD-10-CM | POA: Diagnosis present

## 2014-12-02 DIAGNOSIS — I771 Stricture of artery: Secondary | ICD-10-CM | POA: Diagnosis present

## 2014-12-02 DIAGNOSIS — Z9049 Acquired absence of other specified parts of digestive tract: Secondary | ICD-10-CM | POA: Diagnosis present

## 2014-12-02 HISTORY — PX: ENDARTERECTOMY: SHX5162

## 2014-12-02 LAB — GLUCOSE, CAPILLARY
GLUCOSE-CAPILLARY: 239 mg/dL — AB (ref 65–99)
GLUCOSE-CAPILLARY: 264 mg/dL — AB (ref 65–99)
Glucose-Capillary: 173 mg/dL — ABNORMAL HIGH (ref 65–99)
Glucose-Capillary: 187 mg/dL — ABNORMAL HIGH (ref 65–99)
Glucose-Capillary: 270 mg/dL — ABNORMAL HIGH (ref 65–99)

## 2014-12-02 LAB — CBC
HCT: 33.7 % — ABNORMAL LOW (ref 39.0–52.0)
Hemoglobin: 11 g/dL — ABNORMAL LOW (ref 13.0–17.0)
MCH: 27.5 pg (ref 26.0–34.0)
MCHC: 32.6 g/dL (ref 30.0–36.0)
MCV: 84.3 fL (ref 78.0–100.0)
PLATELETS: 130 10*3/uL — AB (ref 150–400)
RBC: 4 MIL/uL — AB (ref 4.22–5.81)
RDW: 16.2 % — AB (ref 11.5–15.5)
WBC: 10.9 10*3/uL — ABNORMAL HIGH (ref 4.0–10.5)

## 2014-12-02 LAB — CREATININE, SERUM
CREATININE: 0.58 mg/dL — AB (ref 0.61–1.24)
GFR calc Af Amer: >60 mL/min (ref >60)
GFR calc non Af Amer: >60 mL/min (ref >60)

## 2014-12-02 SURGERY — ENDARTERECTOMY, CAROTID
Anesthesia: General | Site: Neck | Laterality: Right

## 2014-12-02 MED ORDER — HEPARIN SODIUM (PORCINE) 1000 UNIT/ML IJ SOLN
INTRAMUSCULAR | Status: AC
  Filled 2014-12-02: qty 1

## 2014-12-02 MED ORDER — PHENYLEPHRINE HCL 10 MG/ML IJ SOLN
20.0000 mg | INTRAMUSCULAR | Status: DC | PRN
  Administered 2014-12-02: 25 ug/min via INTRAVENOUS

## 2014-12-02 MED ORDER — OXYCODONE-ACETAMINOPHEN 5-325 MG PO TABS
1.0000 | ORAL_TABLET | ORAL | Status: DC | PRN
  Administered 2014-12-02 – 2014-12-03 (×3): 1 via ORAL
  Filled 2014-12-02 (×3): qty 1

## 2014-12-02 MED ORDER — CHLORHEXIDINE GLUCONATE CLOTH 2 % EX PADS: 6.0000 | MEDICATED_PAD | Freq: Once | CUTANEOUS | Status: DC

## 2014-12-02 MED ORDER — ASPIRIN 300 MG RE SUPP
300.0000 mg | Freq: Once | RECTAL | Status: AC
  Administered 2014-12-02: 300 mg via RECTAL
  Filled 2014-12-02 (×2): qty 1

## 2014-12-02 MED ORDER — LIDOCAINE HCL (CARDIAC) 20 MG/ML IV SOLN
INTRAVENOUS | Status: DC | PRN
  Administered 2014-12-02 (×2): 60 mg via INTRAVENOUS

## 2014-12-02 MED ORDER — ALBUTEROL SULFATE (2.5 MG/3ML) 0.083% IN NEBU
2.5000 mg | INHALATION_SOLUTION | Freq: Four times a day (QID) | RESPIRATORY_TRACT | Status: DC | PRN
  Administered 2014-12-02: 2.5 mg via RESPIRATORY_TRACT

## 2014-12-02 MED ORDER — SODIUM CHLORIDE 0.9 % IV SOLN
0.1250 ug/kg/min | INTRAVENOUS | Status: AC
  Administered 2014-12-02: .05 ug/kg/min via INTRAVENOUS
  Filled 2014-12-02: qty 2000

## 2014-12-02 MED ORDER — ALUM & MAG HYDROXIDE-SIMETH 200-200-20 MG/5ML PO SUSP: 15.0000 mL | ORAL | Status: DC | PRN

## 2014-12-02 MED ORDER — PHENOL 1.4 % MT LIQD: 1.0000 | OROMUCOSAL | Status: DC | PRN

## 2014-12-02 MED ORDER — ASPIRIN EC 81 MG PO TBEC
81.0000 mg | DELAYED_RELEASE_TABLET | Freq: Every day | ORAL | Status: DC
  Administered 2014-12-02: 81 mg via ORAL
  Filled 2014-12-02 (×2): qty 1

## 2014-12-02 MED ORDER — ALBUTEROL SULFATE (2.5 MG/3ML) 0.083% IN NEBU
INHALATION_SOLUTION | RESPIRATORY_TRACT | Status: AC
  Filled 2014-12-02: qty 3

## 2014-12-02 MED ORDER — LIDOCAINE-EPINEPHRINE (PF) 1 %-1:200000 IJ SOLN
INTRAMUSCULAR | Status: DC | PRN
  Administered 2014-12-02: 10 mL via INTRADERMAL

## 2014-12-02 MED ORDER — SODIUM CHLORIDE 0.9 % IV SOLN: INTRAVENOUS | Status: DC

## 2014-12-02 MED ORDER — INFLUENZA VAC SPLIT QUAD 0.5 ML IM SUSY
0.5000 mL | PREFILLED_SYRINGE | INTRAMUSCULAR | Status: DC
Start: 2014-12-03 — End: 2014-12-03
  Filled 2014-12-02: qty 0.5

## 2014-12-02 MED ORDER — SODIUM CHLORIDE 0.9 % IR SOLN
Status: DC | PRN
  Administered 2014-12-02: 500 mL

## 2014-12-02 MED ORDER — DOCUSATE SODIUM 100 MG PO CAPS: 100.0000 mg | ORAL_CAPSULE | Freq: Every day | ORAL | Status: DC

## 2014-12-02 MED ORDER — EPHEDRINE SULFATE 50 MG/ML IJ SOLN
INTRAMUSCULAR | Status: AC
  Filled 2014-12-02: qty 1

## 2014-12-02 MED ORDER — ESMOLOL HCL 10 MG/ML IV SOLN
INTRAVENOUS | Status: DC | PRN
  Administered 2014-12-02 (×2): 30 mg via INTRAVENOUS

## 2014-12-02 MED ORDER — PHENYLEPHRINE 40 MCG/ML (10ML) SYRINGE FOR IV PUSH (FOR BLOOD PRESSURE SUPPORT)
PREFILLED_SYRINGE | INTRAVENOUS | Status: AC
  Filled 2014-12-02: qty 10

## 2014-12-02 MED ORDER — OXYCODONE HCL 5 MG/5ML PO SOLN: 5.0000 mg | Freq: Once | ORAL | Status: DC | PRN

## 2014-12-02 MED ORDER — INSULIN ASPART 100 UNIT/ML ~~LOC~~ SOLN
0.0000 [IU] | Freq: Once | SUBCUTANEOUS | Status: AC
  Administered 2014-12-02: 5 [IU] via SUBCUTANEOUS

## 2014-12-02 MED ORDER — METOPROLOL TARTRATE 1 MG/ML IV SOLN: 2.0000 mg | INTRAVENOUS | Status: DC | PRN

## 2014-12-02 MED ORDER — SODIUM CHLORIDE 0.9 % IJ SOLN
INTRAMUSCULAR | Status: AC
  Filled 2014-12-02: qty 10

## 2014-12-02 MED ORDER — ACETAMINOPHEN 650 MG RE SUPP
325.0000 mg | RECTAL | Status: DC | PRN
Start: 2014-12-02 — End: 2014-12-03

## 2014-12-02 MED ORDER — PROMETHAZINE HCL 25 MG/ML IJ SOLN: 6.2500 mg | INTRAMUSCULAR | Status: DC | PRN

## 2014-12-02 MED ORDER — GLYCOPYRROLATE 0.2 MG/ML IJ SOLN
INTRAMUSCULAR | Status: AC
  Filled 2014-12-02: qty 1

## 2014-12-02 MED ORDER — SODIUM CHLORIDE 0.9 % IV SOLN: 500.0000 mL | Freq: Once | INTRAVENOUS | Status: DC | PRN

## 2014-12-02 MED ORDER — FENTANYL CITRATE (PF) 250 MCG/5ML IJ SOLN
INTRAMUSCULAR | Status: DC | PRN
  Administered 2014-12-02: 100 ug via INTRAVENOUS

## 2014-12-02 MED ORDER — MELOXICAM 7.5 MG PO TABS
7.5000 mg | ORAL_TABLET | Freq: Every day | ORAL | Status: DC
  Filled 2014-12-02: qty 1

## 2014-12-02 MED ORDER — SUCCINYLCHOLINE CHLORIDE 20 MG/ML IJ SOLN
INTRAMUSCULAR | Status: AC
  Filled 2014-12-02: qty 1

## 2014-12-02 MED ORDER — DEXTROSE 5 % IV SOLN
INTRAVENOUS | Status: AC
  Filled 2014-12-02: qty 1.5

## 2014-12-02 MED ORDER — AMLODIPINE BESYLATE 5 MG PO TABS
5.0000 mg | ORAL_TABLET | Freq: Every day | ORAL | Status: DC
Start: 2014-12-02 — End: 2014-12-03
  Administered 2014-12-02: 5 mg via ORAL
  Filled 2014-12-02 (×2): qty 1

## 2014-12-02 MED ORDER — THROMBIN 20000 UNITS EX SOLR
CUTANEOUS | Status: AC
  Filled 2014-12-02: qty 20000

## 2014-12-02 MED ORDER — ASPIRIN 81 MG PO TABS: 81.0000 mg | ORAL_TABLET | Freq: Every day | ORAL | Status: DC

## 2014-12-02 MED ORDER — PROPOFOL 10 MG/ML IV BOLUS
INTRAVENOUS | Status: DC | PRN
  Administered 2014-12-02: 160 mg via INTRAVENOUS

## 2014-12-02 MED ORDER — ROCURONIUM BROMIDE 50 MG/5ML IV SOLN
INTRAVENOUS | Status: AC
  Filled 2014-12-02: qty 1

## 2014-12-02 MED ORDER — GUAIFENESIN-DM 100-10 MG/5ML PO SYRP: 15.0000 mL | ORAL_SOLUTION | ORAL | Status: DC | PRN

## 2014-12-02 MED ORDER — HYDRALAZINE HCL 20 MG/ML IJ SOLN: 5.0000 mg | INTRAMUSCULAR | Status: DC | PRN

## 2014-12-02 MED ORDER — PROTAMINE SULFATE 10 MG/ML IV SOLN
INTRAVENOUS | Status: DC | PRN
  Administered 2014-12-02: 10 mg via INTRAVENOUS
  Administered 2014-12-02: 20 mg via INTRAVENOUS
  Administered 2014-12-02: 10 mg via INTRAVENOUS

## 2014-12-02 MED ORDER — SIMVASTATIN 10 MG PO TABS
10.0000 mg | ORAL_TABLET | Freq: Every day | ORAL | Status: DC
  Administered 2014-12-02: 10 mg via ORAL
  Filled 2014-12-02 (×2): qty 1

## 2014-12-02 MED ORDER — INSULIN ASPART 100 UNIT/ML ~~LOC~~ SOLN
0.0000 [IU] | Freq: Three times a day (TID) | SUBCUTANEOUS | Status: DC
  Administered 2014-12-02: 8 [IU] via SUBCUTANEOUS

## 2014-12-02 MED ORDER — DEXTROSE 5 % IV SOLN
1.5000 g | Freq: Two times a day (BID) | INTRAVENOUS | Status: DC
  Administered 2014-12-02: 1.5 g via INTRAVENOUS
  Filled 2014-12-02 (×2): qty 1.5

## 2014-12-02 MED ORDER — LIRAGLUTIDE 18 MG/3ML ~~LOC~~ SOPN: 5.0000 mg | PEN_INJECTOR | Freq: Every morning | SUBCUTANEOUS | Status: DC

## 2014-12-02 MED ORDER — SODIUM CHLORIDE 0.9 % IV SOLN
INTRAVENOUS | Status: DC
  Administered 2014-12-02: 50 mL/h via INTRAVENOUS

## 2014-12-02 MED ORDER — ONDANSETRON HCL 4 MG/2ML IJ SOLN: 4.0000 mg | Freq: Four times a day (QID) | INTRAMUSCULAR | Status: DC | PRN

## 2014-12-02 MED ORDER — OXYCODONE HCL 5 MG PO TABS: 5.0000 mg | ORAL_TABLET | Freq: Once | ORAL | Status: DC | PRN

## 2014-12-02 MED ORDER — DEXTRAN 40 IN SALINE 10-0.9 % IV SOLN
INTRAVENOUS | Status: DC | PRN
  Administered 2014-12-02: 500 mL

## 2014-12-02 MED ORDER — HYDRALAZINE HCL 20 MG/ML IJ SOLN
INTRAMUSCULAR | Status: DC | PRN
  Administered 2014-12-02: 10 mg via INTRAVENOUS
  Administered 2014-12-02: 5 mg via INTRAVENOUS
  Administered 2014-12-02: 3 mg via INTRAVENOUS
  Administered 2014-12-02: 2 mg via INTRAVENOUS

## 2014-12-02 MED ORDER — FENTANYL CITRATE (PF) 250 MCG/5ML IJ SOLN
INTRAMUSCULAR | Status: AC
  Filled 2014-12-02: qty 5

## 2014-12-02 MED ORDER — PANTOPRAZOLE SODIUM 40 MG PO TBEC
40.0000 mg | DELAYED_RELEASE_TABLET | Freq: Every day | ORAL | Status: DC
  Administered 2014-12-02: 40 mg via ORAL
  Filled 2014-12-02: qty 1

## 2014-12-02 MED ORDER — LIDOCAINE HCL (CARDIAC) 20 MG/ML IV SOLN
INTRAVENOUS | Status: AC
  Filled 2014-12-02: qty 5

## 2014-12-02 MED ORDER — ACETAMINOPHEN 325 MG PO TABS
325.0000 mg | ORAL_TABLET | ORAL | Status: DC | PRN
  Administered 2014-12-02: 650 mg via ORAL
  Filled 2014-12-02: qty 2

## 2014-12-02 MED ORDER — POTASSIUM CHLORIDE CRYS ER 20 MEQ PO TBCR: 20.0000 meq | EXTENDED_RELEASE_TABLET | Freq: Every day | ORAL | Status: DC | PRN

## 2014-12-02 MED ORDER — MUPIROCIN 2 % EX OINT
TOPICAL_OINTMENT | CUTANEOUS | Status: AC
  Filled 2014-12-02: qty 22

## 2014-12-02 MED ORDER — MORPHINE SULFATE (PF) 2 MG/ML IV SOLN: 2.0000 mg | INTRAVENOUS | Status: DC | PRN

## 2014-12-02 MED ORDER — GLYCOPYRROLATE 0.2 MG/ML IJ SOLN
INTRAMUSCULAR | Status: DC | PRN
  Administered 2014-12-02 (×2): 0.2 mg via INTRAVENOUS

## 2014-12-02 MED ORDER — LIDOCAINE HCL (PF) 1 % IJ SOLN
INTRAMUSCULAR | Status: AC
  Filled 2014-12-02: qty 30

## 2014-12-02 MED ORDER — LABETALOL HCL 5 MG/ML IV SOLN
10.0000 mg | INTRAVENOUS | Status: DC | PRN
  Administered 2014-12-02 – 2014-12-03 (×2): 10 mg via INTRAVENOUS
  Filled 2014-12-02 (×2): qty 4

## 2014-12-02 MED ORDER — LACTATED RINGERS IV SOLN
INTRAVENOUS | Status: DC
  Administered 2014-12-02 (×2): via INTRAVENOUS

## 2014-12-02 MED ORDER — MAGNESIUM SULFATE 2 GM/50ML IV SOLN: 2.0000 g | Freq: Every day | INTRAVENOUS | Status: DC | PRN

## 2014-12-02 MED ORDER — SUCCINYLCHOLINE CHLORIDE 20 MG/ML IJ SOLN
INTRAMUSCULAR | Status: DC | PRN
  Administered 2014-12-02: 100 mg via INTRAVENOUS

## 2014-12-02 MED ORDER — DEXTRAN 40 IN SALINE 10-0.9 % IV SOLN
INTRAVENOUS | Status: AC
  Filled 2014-12-02: qty 500

## 2014-12-02 MED ORDER — IOHEXOL 350 MG/ML SOLN
100.0000 mL | Freq: Once | INTRAVENOUS | Status: AC | PRN
  Administered 2014-12-02: 80 mL via INTRAVENOUS

## 2014-12-02 MED ORDER — ONDANSETRON HCL 4 MG/2ML IJ SOLN
INTRAMUSCULAR | Status: DC | PRN
  Administered 2014-12-02: 4 mg via INTRAVENOUS

## 2014-12-02 MED ORDER — PROPOFOL 10 MG/ML IV BOLUS
INTRAVENOUS | Status: AC
  Filled 2014-12-02: qty 20

## 2014-12-02 MED ORDER — GLIPIZIDE ER 2.5 MG PO TB24
2.5000 mg | ORAL_TABLET | Freq: Two times a day (BID) | ORAL | Status: DC
  Administered 2014-12-02: 2.5 mg via ORAL
  Filled 2014-12-02 (×4): qty 1

## 2014-12-02 MED ORDER — ENOXAPARIN SODIUM 40 MG/0.4ML ~~LOC~~ SOLN
40.0000 mg | SUBCUTANEOUS | Status: DC
  Filled 2014-12-02: qty 0.4

## 2014-12-02 MED ORDER — CLOPIDOGREL BISULFATE 75 MG PO TABS
75.0000 mg | ORAL_TABLET | Freq: Every day | ORAL | Status: DC
  Filled 2014-12-02: qty 1

## 2014-12-02 MED ORDER — HEPARIN SODIUM (PORCINE) 1000 UNIT/ML IJ SOLN
INTRAMUSCULAR | Status: DC | PRN
Start: 2014-12-02 — End: 2014-12-02
  Administered 2014-12-02: 9000 [IU] via INTRAVENOUS

## 2014-12-02 MED ORDER — CEFUROXIME SODIUM 1.5 G IJ SOLR
1.5000 g | INTRAMUSCULAR | Status: AC
  Administered 2014-12-02: 1.5 g via INTRAVENOUS

## 2014-12-02 MED ORDER — HYDROMORPHONE HCL 1 MG/ML IJ SOLN
INTRAMUSCULAR | Status: AC
  Filled 2014-12-02: qty 1

## 2014-12-02 MED ORDER — NICARDIPINE HCL IN NACL 20-0.86 MG/200ML-% IV SOLN
3.0000 mg/h | INTRAVENOUS | Status: DC
  Administered 2014-12-02: 5 mg/h via INTRAVENOUS
  Administered 2014-12-02: 10 mg/h via INTRAVENOUS
  Filled 2014-12-02: qty 200

## 2014-12-02 MED ORDER — 0.9 % SODIUM CHLORIDE (POUR BTL) OPTIME
TOPICAL | Status: DC | PRN
  Administered 2014-12-02: 2000 mL

## 2014-12-02 MED ORDER — LIDOCAINE-EPINEPHRINE (PF) 1 %-1:200000 IJ SOLN
INTRAMUSCULAR | Status: AC
  Filled 2014-12-02: qty 30

## 2014-12-02 MED ORDER — HYDROMORPHONE HCL 1 MG/ML IJ SOLN: 0.2500 mg | INTRAMUSCULAR | Status: DC | PRN

## 2014-12-02 SURGICAL SUPPLY — 40 items
BAG DECANTER FOR FLEXI CONT (MISCELLANEOUS) ×3 IMPLANT
CANISTER SUCTION 2500CC (MISCELLANEOUS) ×3 IMPLANT
CANNULA VESSEL 3MM 2 BLNT TIP (CANNULA) ×6 IMPLANT
CATH ROBINSON RED A/P 18FR (CATHETERS) ×3 IMPLANT
CLIP TI MEDIUM 24 (CLIP) ×3 IMPLANT
CLIP TI WIDE RED SMALL 24 (CLIP) ×6 IMPLANT
CRADLE DONUT ADULT HEAD (MISCELLANEOUS) ×3 IMPLANT
DRAIN CHANNEL 15F RND FF W/TCR (WOUND CARE) IMPLANT
ELECT REM PT RETURN 9FT ADLT (ELECTROSURGICAL) ×3
ELECTRODE REM PT RTRN 9FT ADLT (ELECTROSURGICAL) ×1 IMPLANT
EVACUATOR SILICONE 100CC (DRAIN) IMPLANT
GLOVE BIO SURGEON STRL SZ7.5 (GLOVE) ×9 IMPLANT
GLOVE BIOGEL PI IND STRL 6.5 (GLOVE) ×3 IMPLANT
GLOVE BIOGEL PI IND STRL 8 (GLOVE) ×2 IMPLANT
GLOVE BIOGEL PI INDICATOR 6.5 (GLOVE) ×6
GLOVE BIOGEL PI INDICATOR 8 (GLOVE) ×4
GLOVE SKINSENSE NS SZ6.5 (GLOVE) ×2
GLOVE SKINSENSE STRL SZ6.5 (GLOVE) ×1 IMPLANT
GLOVE SURG SS PI 6.5 STRL IVOR (GLOVE) ×6 IMPLANT
GOWN STRL REUS W/ TWL LRG LVL3 (GOWN DISPOSABLE) ×3 IMPLANT
GOWN STRL REUS W/TWL LRG LVL3 (GOWN DISPOSABLE) ×6
KIT BASIN OR (CUSTOM PROCEDURE TRAY) ×3 IMPLANT
KIT ROOM TURNOVER OR (KITS) ×3 IMPLANT
LIQUID BAND (GAUZE/BANDAGES/DRESSINGS) ×3 IMPLANT
NEEDLE HYPO 25X1 1.5 SAFETY (NEEDLE) ×3 IMPLANT
NS IRRIG 1000ML POUR BTL (IV SOLUTION) ×6 IMPLANT
PACK CAROTID (CUSTOM PROCEDURE TRAY) ×3 IMPLANT
PAD ARMBOARD 7.5X6 YLW CONV (MISCELLANEOUS) ×6 IMPLANT
SHUNT CAROTID BYPASS 10 (VASCULAR PRODUCTS) IMPLANT
SHUNT CAROTID BYPASS 12FRX15.5 (VASCULAR PRODUCTS) IMPLANT
SPONGE INTESTINAL PEANUT (DISPOSABLE) IMPLANT
SPONGE SURGIFOAM ABS GEL 100 (HEMOSTASIS) IMPLANT
SUT PROLENE 6 0 BV (SUTURE) ×6 IMPLANT
SUT PROLENE 7 0 BV 1 (SUTURE) IMPLANT
SUT SILK 2 0 FS (SUTURE) IMPLANT
SUT VIC AB 3-0 SH 27 (SUTURE) ×2
SUT VIC AB 3-0 SH 27X BRD (SUTURE) ×1 IMPLANT
SUT VICRYL 4-0 PS2 18IN ABS (SUTURE) ×3 IMPLANT
SYR CONTROL 10ML LL (SYRINGE) ×3 IMPLANT
WATER STERILE IRR 1000ML POUR (IV SOLUTION) ×3 IMPLANT

## 2014-12-02 NOTE — Progress Notes (Signed)
cardene d/c'd / bp stable

## 2014-12-02 NOTE — Interval H&P Note (Signed)
History and Physical Interval Note:  12/02/2014 7:04 AM  Barry Shepherd  has presented today for surgery, with the diagnosis of Right carotid artery stenosis I65.21  The various methods of treatment have been discussed with the patient and family. After consideration of risks, benefits and other options for treatment, the patient has consented to  Procedure(s): ENDARTERECTOMY CAROTID (Right) as a surgical intervention .  The patient's history has been reviewed, patient examined, no change in status, stable for surgery.  I have reviewed the patient's chart and labs.  Questions were answered to the patient's satisfaction.     Waverly Ferrari

## 2014-12-02 NOTE — Progress Notes (Signed)
Utilization review completed.  

## 2014-12-02 NOTE — Transfer of Care (Signed)
Immediate Anesthesia Transfer of Care Note  Patient: Barry Shepherd  Procedure(s) Performed: Procedure(s): Attempted ENDARTERECTOMY CAROTID WITH PATCH ANGIOPLASTY (Right)  Patient Location: PACU  Anesthesia Type:General  Level of Consciousness: awake, alert , oriented and patient cooperative  Airway & Oxygen Therapy: Patient Spontanous Breathing and Patient connected to nasal cannula oxygen  Post-op Assessment: Report given to RN, Post -op Vital signs reviewed and stable and Patient moving all extremities  Post vital signs: Reviewed and stable  Last Vitals:  Filed Vitals:   12/02/14 0632  BP: 189/54  Pulse: 59  Temp: 36.4 C  Resp: 20    Complications: No apparent anesthesia complications

## 2014-12-02 NOTE — Progress Notes (Signed)
   VASCULAR SURGERY POST OP CHECK:  * Doing well.   * I reviewed his CTA with Dr. Chestine Spore. The disease in the right ICA extends to the base of the skull.  Likewise the plaque on the left extends to the skullbase.  * Plan discharge in AM. I will review CTA with Dr. Myra Gianotti next week, to see if the patient is a candidate for a carotid stent. Options will be optimal medical management vs. That plus stent. He is asymptomatic. I have discussed the importance of tobacco cessation with him. He is on ASA and a statin, and we have added Plavix.  SUBJECTIVE: No complaints  PHYSICAL EXAM: Filed Vitals:   12/02/14 1215 12/02/14 1220 12/02/14 1330 12/02/14 1500  BP: 147/55  144/67 160/64  Pulse: 88 93 78 74  Temp: 98 F (36.7 C)   98.5 F (36.9 C)  TempSrc: Oral   Oral  Resp:  Height:  (1.651 m)     Weight:      SpO2: 98% 92% 98% 96%   Incision looks fine Neuro intact.   CBG (last 3)   Recent Labs  12/02/14 1027 12/02/14 1407 12/02/14 1654  GLUCAP 187* 239* 270*    Active Problems:   Carotid stenosis, asymptomatic   Cari Caraway Beeper: [phone] 12/02/2014

## 2014-12-02 NOTE — Anesthesia Postprocedure Evaluation (Signed)
  Anesthesia Post-op Note  Patient: Barry Shepherd  Procedure(s) Performed: Procedure(s): Attempted ENDARTERECTOMY CAROTID WITH PATCH ANGIOPLASTY (Right)  Patient Location: PACU  Anesthesia Type:General  Level of Consciousness: awake and alert   Airway and Oxygen Therapy: Patient Spontanous Breathing  Post-op Pain: none  Post-op Assessment: Post-op Vital signs reviewed LLE Motor Response: Purposeful movement LLE Sensation: Full sensation RLE Motor Response: Purposeful movement RLE Sensation: Full sensation      Post-op Vital Signs: Reviewed  Last Vitals:  Filed Vitals:   12/02/14 1500  BP: 160/64  Pulse: 74  Temp:   Resp: 19    Complications: No apparent anesthesia complications

## 2014-12-02 NOTE — Progress Notes (Signed)
    NAME: Barry Shepherd    MRN: [id number] DOB: 1946-01-22    DATE OF OPERATION: 12/02/2014  PREOP DIAGNOSIS: Asymptomatic greater than 80% right carotid stenosis  POSTOP DIAGNOSIS: same  PROCEDURE: Exploration of right carotid artery   SURGEON: Di Kindle. Edilia Bo, MD, FACS  ASSIST: Karsten Ro, [facility]  ANESTHESIA: Gen.   EBL: minimal  INDICATIONS: Barry Shepherd is a 68 y.o. male who was being followed with carotid disease. The right carotid stenosis progressed to greater than 80% and right carotid endarterectomy was recommended in order to lower his risk of future stroke.  FINDINGS: The plaque was not surgically accessible. I was able to get above the most significant area of the plaque, however even above this the artery was thickened. The bifurcation was somewhat high in the internal carotid artery and the ICA was rotated posteriorly making exposure distally very difficult. The patient had a good Doppler signal distal to the plaque and I felt that proceeding with endarterectomy could potentially result in incomplete endarterectomy with poor visualization and high-risk for stroke.  TECHNIQUE: The patient was taken to the operative room after an arterial line was placed by anesthesia. Patient received a general anesthetic. After careful positioning, the right neck was prepped and draped in the usual sterile fashion. An incision was made along the anterior border of the sternocleidomastoid and the dissection carried down to the common carotid artery which was dissected free and controlled with a Rummel tourniquet. The common carotid artery was slightly thickened. The superior thyroid artery and external carotid arteries were controlled. The patient was heparinized with 9000 units of IV heparin. Bifurcation was high and I had to fully mobilize the hypoglossal nerve in order to expose the distal internal carotid artery. The artery was rotated posteriorly making exposure difficult. I was ultimately  able to control the internal carotid artery above the bulk of the plaque, however even at this level the artery was thickened. As noted above was concerned about not being able to complete the endarterectomy, not having a good endpoint, and having poor visualization distally. As he had an excellent Doppler signal distal to the plaque I elected to stop at this point. The plan would be for CT angiogram postoperatively in order to evaluate him for potential carotid stenting. Hemostasis was obtained in the wound and heparin was partially reversed with protamine. The wound was irrigated with saline and closed with a deep layer of 3-0 Vicryl. The platysma was closed with running 3-0 Vicryl. The skin was closed with a 4-0 subcuticular stitch. Liquid bandage was applied. The patient tolerated the procedure well and awoke neurologically intact. All needle and sponge counts were correct. He was transferred to the recovery room in stable condition.  Waverly Ferrari, MD, FACS [facility]  DATE OF DICTATION:   12/02/2014

## 2014-12-02 NOTE — Progress Notes (Signed)
Pt arrived to 3 south. A&Ox4, incision clean dry and intact, tongue midline. No other skin issues noted. VSS, will continue to monitor.

## 2014-12-02 NOTE — Anesthesia Procedure Notes (Signed)
Procedure Name: Intubation Date/Time: 12/02/2014 8:30 AM Performed by: Julian Reil Pre-anesthesia Checklist: Emergency Drugs available, Patient identified, Suction available and Patient being monitored Patient Re-evaluated:Patient Re-evaluated prior to inductionOxygen Delivery Method: Circle system utilized Preoxygenation: Pre-oxygenation with 100% oxygen Intubation Type: IV induction Ventilation: Mask ventilation without difficulty and Oral airway inserted - appropriate to patient size Laryngoscope Size: Mac and 4 Grade View: Grade I Tube type: Oral Tube size: 7.5 mm Number of attempts: 1 Airway Equipment and Method: Stylet and LTA kit utilized Placement Confirmation: ETT inserted through vocal cords under direct vision,  positive ETCO2 and breath sounds checked- equal and bilateral Secured at: 23 cm Tube secured with: Tape Dental Injury: Teeth and Oropharynx as per pre-operative assessment

## 2014-12-02 NOTE — H&P (View-Only) (Signed)
[facility] [place] HISTORY AND PHYSICAL   MRN : [id number]  History of Present Illness:   Barry Shepherd is a 69 y.o. male patient of [name] who returns today for follow up of known bilateral carotid disease and peripheral vascular disease.  He has known left SFA occlusion.   With respect to his carotid disease, he denies any history of stroke, TIAs, expressive or receptive aphasia, or amaurosis fugax.   He denies nonhealing ulcers. 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 He reports he had pain in the left buttocks after walking about 100-200 feet, relieved with rest; but he states that he has been diagnosed with bilateral hip arthritis and lumbar spine issues. He has no pain in his right hip/leg.  Patient has not had previous carotid artery intervention.  Patient reports New Medical or Surgical History: had a stress test February 2016 in [place], [place], pt does not recall cardiologist name, states result was fine. Also states his COPD testing was good recently.  Pt Diabetic: Yes, self reported home glucose 117-200, sounds almost in control Pt smoker: smoker (1 ppd, started at age 77 yrs)  Pt meds include: Statin : No ASA: Yes Other anticoagulants/antiplatelets: no   Current Outpatient Prescriptions  Medication Sig Dispense Refill  . ACCU-CHEK AVIVA PLUS test strip     . ACCU-CHEK SOFTCLIX LANCETS lancets     . aspirin 81 MG tablet Take 81 mg by mouth every morning.    . Blood Glucose Calibration (ACCU-CHEK ACTIVE GLUCOSE CONT VI) CHECK GLUCOSE DAILY    . Blood Glucose Monitoring Suppl (FIFTY50 GLUCOSE METER 2.0) W/DEVICE KIT 1 each by Other route once. Use as instructed    . glipiZIDE (GLUCOTROL XL) 2.5 MG 24 hr tablet 2.5 mg daily with breakfast.     . glipiZIDE (GLUCOTROL) 10 MG tablet Take 10 mg by mouth  2 (two) times daily before a meal.    . LEVEMIR FLEXTOUCH 100 UNIT/ML Pen at bedtime.    Marland Kitchen lisinopril (PRINIVIL,ZESTRIL) 40 MG tablet Take 40 mg by mouth daily.    . meloxicam (MOBIC) 15 MG tablet Take 15 mg by mouth daily.    . meloxicam (MOBIC) 7.5 MG tablet at bedtime.    . metFORMIN (GLUCOPHAGE) 1000 MG tablet Take 1,000 mg by mouth 2 (two) times daily with a meal.    . metoprolol succinate (TOPROL-XL) 25 MG 24 hr tablet Take 25 mg by mouth daily.    . Omega-3 Fatty Acids (FISH OIL) 1000 MG CAPS Take by mouth.    . Tamsulosin HCl (FLOMAX) 0.4 MG CAPS Take 0.4 mg by mouth daily.    Marland Kitchen VICTOZA 18 MG/3ML SOPN every morning.     No current facility-administered medications for this visit.    Past Medical History  Diagnosis Date  . Diabetes mellitus without complication   . Carotid artery occlusion   . Hypertension   . Hyperlipidemia     Social History Social History  Substance Use Topics  . Smoking status: Current Every Day Smoker -- 1.50 packs/day for 50 years    Types: Cigarettes  . Smokeless tobacco: Never Used  . Alcohol Use: No    Family History Family History  Problem Relation Age of Onset  . Heart disease Father     before age 53  . Hyperlipidemia Father   .  Hypertension Father   . Heart attack Father   . Heart disease Mother   . Hypertension Mother   . Heart disease Brother     After age 19    Surgical History Past Surgical History  Procedure Laterality Date  . Cholecystectomy    . Cardiovascular stress test  Oct. 2015    No Known Allergies  Current Outpatient Prescriptions  Medication Sig Dispense Refill  . ACCU-CHEK AVIVA PLUS test strip     . ACCU-CHEK SOFTCLIX LANCETS lancets     . aspirin 81 MG tablet Take 81 mg by mouth every morning.    . Blood Glucose Calibration (ACCU-CHEK ACTIVE GLUCOSE CONT VI) CHECK GLUCOSE DAILY    . Blood Glucose Monitoring Suppl (FIFTY50 GLUCOSE METER 2.0) W/DEVICE KIT 1 each by Other route once. Use as instructed     . glipiZIDE (GLUCOTROL XL) 2.5 MG 24 hr tablet 2.5 mg daily with breakfast.     . glipiZIDE (GLUCOTROL) 10 MG tablet Take 10 mg by mouth 2 (two) times daily before a meal.    . LEVEMIR FLEXTOUCH 100 UNIT/ML Pen at bedtime.    Marland Kitchen lisinopril (PRINIVIL,ZESTRIL) 40 MG tablet Take 40 mg by mouth daily.    . meloxicam (MOBIC) 15 MG tablet Take 15 mg by mouth daily.    . meloxicam (MOBIC) 7.5 MG tablet at bedtime.    . metFORMIN (GLUCOPHAGE) 1000 MG tablet Take 1,000 mg by mouth 2 (two) times daily with a meal.    . metoprolol succinate (TOPROL-XL) 25 MG 24 hr tablet Take 25 mg by mouth daily.    . Omega-3 Fatty Acids (FISH OIL) 1000 MG CAPS Take by mouth.    . Tamsulosin HCl (FLOMAX) 0.4 MG CAPS Take 0.4 mg by mouth daily.    Marland Kitchen VICTOZA 18 MG/3ML SOPN every morning.     No current facility-administered medications for this visit.     REVIEW OF SYSTEMS: See HPI for pertinent positives and negatives.  Physical Examination Filed Vitals:   11/10/14 1101 11/10/14 1103  BP: 177/79 168/77  Pulse: 65   Height: 5' 6"  (1.676 m)   Weight: 203 lb 6.4 oz (92.262 kg)   SpO2: 96%    Body mass index is 32.85 kg/(m^2).  General: WDWN obese male in NAD GAIT: normal Eyes: PERRLA Pulmonary: Non-labored, limited air movement in all fields, Negative Rales, Negative rhonchi, & Negative wheezing.  Cardiac: regular Rhythm, positive murmur.  VASCULAR EXAM Carotid Bruits Left Right   Transmitted cardiac murmur Transmitted cardiac murmur   Aorta is not palpable. Radial pulses are 2+ palpable and equal.      LE Pulses LEFT RIGHT   FEMORAL 1+ palpable 2+ palpable    POPLITEAL not palpable  not palpable   POSTERIOR TIBIAL not palpable   1+palpable    DORSALIS PEDIS  ANTERIOR TIBIAL 1+  palpable  not palpable     Gastrointestinal: soft, nontender, BS WNL, no r/g, no palpated masses, large panus.  Musculoskeletal: Negative muscle atrophy/wasting. M/S 5/5 throughout, Extremities without ischemic changes  Neurologic: A&O X 3; Appropriate Affect, Speech is normal CN 2-12 intact, Pain and light touch intact in extremities, Motor exam as listed above.              Non-Invasive Vascular Imaging (11/10/2014):  CEREBROVASCULAR DUPLEX EVALUATION    INDICATION: Carotid artery disease    PREVIOUS INTERVENTION(S): None    DUPLEX EXAM: Carotid duplex    RIGHT  LEFT  Peak Systolic Velocities (cm/s) End  Diastolic Velocities (cm/s) Plaque LOCATION Peak Systolic Velocities (cm/s) End Diastolic Velocities (cm/s) Plaque  156 13 - CCA PROXIMAL 120 16 HT  84 11 - CCA MID 101 17 HT  62 14 - CCA DISTAL 78 13 -  181 18 - ECA 116 12 -  367 116 HT ICA PROXIMAL 245 78 CP  127 29 - ICA MID 206 37 -  106 21 - ICA DISTAL 200 49 -    4.3 ICA / CCA Ratio (PSV) 2.4  Antegrade Vertebral Flow Antegrade  275 Brachial Systolic Pressure (mmHg) 170  Triphasic Brachial Artery Waveforms Triphasic    Plaque Morphology:  HM = Homogeneous, HT = Heterogeneous, CP = Calcific Plaque, SP = Smooth Plaque, IP = Irregular Plaque     ADDITIONAL FINDINGS:     IMPRESSION: 1. 80 - 99% right internal carotid artery stenosis. 2. 60 - 79% left internal carotid artery stenosis, calcific plaque may obscure higher velocity    Compared to the previous exam:  Increased velocity bilaterally    ABI (Date: 11/10/2014)  R: 1.0 (1.01, 07/28/14), DP: biphasic, PT: triphasic, TBI: 0.66  L: 0.80 (0.77), DP: monophasic, PT: monophasic, TBI: 0.66    ASSESSMENT:  Barry Shepherd is a 69 y.o. male who has bilateral ICA stenoses, and mild PAD in his left LE with a known left SFA occlusion. The pain in his left buttock and leg is not c/w PAD but is c/w his known lumbar spine/left radiculopathy issues. He  has been seen in a pain management clinic for this. He has no right LE pain. He has no non healing wounds in his feet or lower legs. He has no history of stroke or TIA. Today's carotid Duplex suggests 80 - 99% right internal carotid artery stenosis and 60 - 79% left internal carotid artery stenosis, calcific plaque may obscure higher velocity. Increased velocity bilaterally.  ABI's remain stable.  His diabetes seems to be under better control, but unfortunately he continues to smoke a ppd, which is a decrease in use for him. Pt reports a February 2016 cardiac stress test as being normal; he reports what sounds like recent pulmonary function tests as being close to normal.  [name] spoke with pt.  Face to face time with patient was 25 minutes. Over 50% of this time was spent on counseling and coordination of care.   PLAN:   The patient was counseled re smoking cessation and given several free resources re smoking cessation.  Based on today's exam and non-invasive vascular lab results, the patient will be scheduled for right CEA on 12/02/14 by [name] I discussed in depth with the patient the nature of atherosclerosis, and emphasized the importance of maximal medical management including strict control of blood pressure, blood glucose, and lipid levels, obtaining regular exercise, and cessation of smoking.  The patient is aware that without maximal medical management the underlying atherosclerotic disease process will progress, limiting the benefit of any interventions.  The patient was given information about stroke prevention and what symptoms should prompt the patient to seek immediate medical care.  The patient was given information about PAD including signs, symptoms, treatment, what symptoms should prompt the patient to seek immediate medical care, and risk reduction measures to take. Thank you for allowing [place] to participate in this patient's care.  Clemon Chambers, RN, MSN,  FNP-C [facility] Office: [phone]  Clinic MD: Scot Dock  11/10/2014 11:16 AM

## 2014-12-03 ENCOUNTER — Encounter (HOSPITAL_COMMUNITY): Payer: Self-pay | Admitting: Vascular Surgery

## 2014-12-03 LAB — BASIC METABOLIC PANEL
Anion gap: 9 (ref 5–15)
BUN: 10 mg/dL (ref 6–20)
CALCIUM: 8.4 mg/dL — AB (ref 8.9–10.3)
CO2: 25 mmol/L (ref 22–32)
CREATININE: 0.78 mg/dL (ref 0.61–1.24)
Chloride: 100 mmol/L — ABNORMAL LOW (ref 101–111)
GFR calc Af Amer: >60 mL/min (ref >60)
GLUCOSE: 184 mg/dL — AB (ref 65–99)
Potassium: 4.5 mmol/L (ref 3.5–5.1)
Sodium: 134 mmol/L — ABNORMAL LOW (ref 135–145)

## 2014-12-03 LAB — CBC
HEMATOCRIT: 41.2 % (ref 39.0–52.0)
Hemoglobin: 13.5 g/dL (ref 13.0–17.0)
MCH: 27.6 pg (ref 26.0–34.0)
MCHC: 32.8 g/dL (ref 30.0–36.0)
MCV: 84.1 fL (ref 78.0–100.0)
PLATELETS: 175 10*3/uL (ref 150–400)
RBC: 4.9 MIL/uL (ref 4.22–5.81)
RDW: 16.5 % — AB (ref 11.5–15.5)
WBC: 11.9 10*3/uL — AB (ref 4.0–10.5)

## 2014-12-03 MED ORDER — SIMVASTATIN 10 MG PO TABS: 10.0000 mg | ORAL_TABLET | Freq: Every day | ORAL | Status: AC

## 2014-12-03 MED ORDER — OXYCODONE-ACETAMINOPHEN 5-325 MG PO TABS: 1.0000 | ORAL_TABLET | Freq: Four times a day (QID) | ORAL | Status: AC | PRN

## 2014-12-03 MED ORDER — CLOPIDOGREL BISULFATE 75 MG PO TABS: 75.0000 mg | ORAL_TABLET | Freq: Every day | ORAL | Status: AC

## 2014-12-03 NOTE — Progress Notes (Signed)
Patient prescription and discharge instructions given and explained to patient and wife. All questions answered. PIVs discontinued, no signs of infection or bleeding. Patient declined all morning medications and flu vaccine. Belongings sent with patient and wife. Patient discharged via wheelchair by Gigi Gin, NT.

## 2014-12-03 NOTE — Progress Notes (Signed)
   VASCULAR SURGERY ASSESSMENT & PLAN:  * 1 Day Post-Op s/p: Exploration of right carotid artery  * I reviewed his CTA with Dr. Chestine Spore. The disease in the right ICA extends to the base of the skull. Likewise the plaque on the left extends to the skullbase. Plan for discharge this AM. I will review CTA with Dr. Myra Gianotti next week, to see if the patient is a candidate for a carotid stent. Options will be optimal medical management vs. that plus stent. He is asymptomatic. I have discussed the importance of tobacco cessation with him. He is on ASA and a statin, and we have added Plavix.  SUBJECTIVE: No complaints. Once to go home.  PHYSICAL EXAM: Filed Vitals:   12/02/14 2101 12/02/14 2318 12/03/14 0045 12/03/14 0417  BP: 153/66 164/55 151/61 159/59  Pulse: 88 80 70 75  Temp: 98.5 F (36.9 C) 98.8 F (37.1 C)  98.3 F (36.8 C)  TempSrc: Oral Oral  Oral  Resp: Height:      Weight:      SpO2: 98% 97% 90% 92%   Incision looks fine. Neuro intact.  LABS: Lab Results  Component Value Date   WBC 11.9* 12/03/2014   HGB 13.5 12/03/2014   HCT 41.2 12/03/2014   MCV 84.1 12/03/2014   PLT 175 12/03/2014   Lab Results  Component Value Date   CREATININE 0.78 12/03/2014   Lab Results  Component Value Date   INR 0.91 11/24/2014   CBG (last 3)   Recent Labs  12/02/14 1407 12/02/14 1654 12/02/14 2101  GLUCAP 239* 270* 264*    Active Problems:   Carotid stenosis, asymptomatic   Cari Caraway Beeper: [phone] 12/03/2014

## 2014-12-06 NOTE — Discharge Summary (Signed)
[facility] Discharge Summary  Barry Shepherd 02/10/1946 69 y.o. male  [id number]  Admission Date: 12/02/2014  Discharge Date: 12/03/2014  Physician: Christopher Dickson, MD  Admission Diagnosis: Right carotid artery stenosis I65.21  HPI:   This is a 69 y.o. male male patient of Dr. Dickson who returns today for follow up of known bilateral carotid disease and peripheral vascular disease.  He has known left SFA occlusion.   With respect to his carotid disease, he denies any history of stroke, TIAs, expressive or receptive aphasia, or amaurosis fugax.   He denies nonhealing ulcers. 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 He reports he had pain in the left buttocks after walking about 100-200 feet, relieved with rest; but he states that he has been diagnosed with bilateral hip arthritis and lumbar spine issues. He has no pain in his right hip/leg.  Patient has not had previous carotid artery intervention.  Patient reports New Medical or Surgical History: had a stress test February 2016 in [place], [place], pt does not recall cardiologist name, states result was fine. Also states his COPD testing was good recently.   Hospital Course:  The patient was admitted to the hospital and taken to the operating room on 12/02/2014 and underwent attempted right carotid endarterectomy.  Findings: The plaque was not surgically accessible. I was able to get above the most significant area of the plaque, however even above this the artery was thickened. The bifurcation was somewhat high in the internal carotid artery and the ICA was rotated posteriorly making exposure distally very difficult. The patient had a good Doppler signal distal to the plaque and I felt that proceeding with endarterectomy could potentially result in incomplete  endarterectomy with poor visualization and high-risk for stroke.  The patient tolerated the procedure well and was transported to the PACU in stable condition. A CTA neck was ordered for further evaluation.   By POD 1, the patient's neuro status was intact. The incision was clean and intact. The CTA was reviewed showing disease in the right ICA extending to the base of the skull. Similar findings on the left. Dr. Dickson to review CTA with Dr. Brabham the following week to see if the patient is a candidate for carotid stent.   The patient was discharged home on POD 1 in good condition. He was discharged with ASA, a statin and plavix.   Discharge Instructions:   The patient is discharged to home with extensive instructions on wound care and progressive ambulation.  They are instructed not to drive or perform any heavy lifting until returning to see the physician in his office.  Discharge Instructions    CAROTID Sugery: Call MD for difficulty swallowing or speaking; weakness in arms or legs that is a new symtom; severe headache.  If you have increased swelling in the neck and/or  are having difficulty breathing, CALL [phone]    Complete by:  As directed      Call MD for:  redness, tenderness, or signs of infection (pain, swelling, bleeding, redness, odor or green/yellow discharge around incision site)    Complete by:  As directed      Call MD for:  temperature >100.5    Complete by:  As directed      Discharge wound care:    Complete by:  As directed   Wash   wound daily with soap and water and pat dry.     Driving Restrictions    Complete by:  As directed   No driving for 1 week     Lifting restrictions    Complete by:  As directed   No lifting for 2 weeks     Resume previous diet    Complete by:  As directed            Discharge Diagnosis:  Right carotid artery stenosis I65.21  Secondary Diagnosis: Patient Active Problem List   Diagnosis Date Noted  . Carotid stenosis, asymptomatic  12/02/2014  . Carotid stenosis 01/27/2014  . Occlusion and stenosis of carotid artery without mention of cerebral infarction 01/09/2012  . Peripheral vascular disease, unspecified 01/09/2012   Past Medical History  Diagnosis Date  . Diabetes mellitus without complication   . Carotid artery occlusion   . Hypertension   . Hyperlipidemia       Medication List    TAKE these medications        ACCU-CHEK ACTIVE GLUCOSE CONT VI  CHECK GLUCOSE DAILY     ACCU-CHEK AVIVA PLUS test strip  Generic drug:  glucose blood     ACCU-CHEK SOFTCLIX LANCETS lancets     amLODipine 5 MG tablet  Commonly known as:  NORVASC  Take 5 mg by mouth daily.     aspirin 81 MG tablet  Take 81 mg by mouth 3 (three) times a week.     clopidogrel 75 MG tablet  Commonly known as:  PLAVIX  Take 1 tablet (75 mg total) by mouth daily.     FIFTY50 GLUCOSE METER 2.0 W/DEVICE Kit  1 each by Other route once. Use as instructed     Fish Oil 1000 MG Caps  Take 2 capsules by mouth daily.     glipiZIDE 2.5 MG 24 hr tablet  Commonly known as:  GLUCOTROL XL  Take 2.5 mg by mouth 2 (two) times daily.     LEVEMIR FLEXTOUCH 100 UNIT/ML Pen  Generic drug:  Insulin Detemir  Inject 10 Units into the skin at bedtime.     meloxicam 7.5 MG tablet  Commonly known as:  MOBIC  Take 7.5 mg by mouth daily.     metFORMIN 1000 MG tablet  Commonly known as:  GLUCOPHAGE  Take 1,000 mg by mouth 2 (two) times daily with a meal.     multivitamin with minerals tablet  Take 1 tablet by mouth 2 (two) times daily.     oxyCODONE-acetaminophen 5-325 MG per tablet  Commonly known as:  PERCOCET/ROXICET  Take 1-2 tablets by mouth every 6 (six) hours as needed for moderate pain.     simvastatin 10 MG tablet  Commonly known as:  ZOCOR  Take 1 tablet (10 mg total) by mouth daily at 6 PM.     VICTOZA 18 MG/3ML Sopn  Generic drug:  Liraglutide  Inject 5 mg into the muscle every morning.        Percocet #20 No  Refill  Disposition: Home  Patient's condition: is Good  Follow up: 1. Dr.  Dickson in 2 weeks.   Lilie Vezina, PA-C [facility] [phone]  --- For VQI Registry use --- Instructions: Press F2 to tab through selections.  Delete question if not applicable.   Modified Rankin score at D/C (0-6): 0  IV medication needed for:  1. Hypertension: Yes 2. Hypotension: Yes  Post-op Complications: No  1. Post-op CVA or TIA: No    2. CN injury: No  3. Myocardial infarction: No  4.  CHF: No  5.  Dysrhythmia (new): No  6. Wound infection: No  7. Reperfusion symptoms: No  8. Return to OR: No  Discharge medications: Statin use:  Yes If No: [ ] For Medical reasons, [ ] Non-compliant, [ ] Not-indicated ASA use:  Yes  If No: [ ] For Medical reasons, [ ] Non-compliant, [ ] Not-indicated Beta blocker use:  No If No: [ ] For Medical reasons, [ ] Non-compliant, [x ] Not-indicated ACE-Inhibitor use:  No If No: [ ] For Medical reasons, [ ] Non-compliant, [x ] Not-indicated P2Y12 Antagonist use: Yes, [x ] Plavix, [ ] Plasugrel, [ ] Ticlopinine, [ ] Ticagrelor, [ ] Other, [ ] No for medical reason, [ ] Non-compliant, [ ] Not-indicated Anti-coagulant use:  No, [ ] Warfarin, [ ] Rivaroxaban, [ ] Dabigatran, [ ] Other, [ ] No for medical reason, [ ] Non-compliant, [x ] Not-indicated   

## 2014-12-06 NOTE — Op Note (Signed)
     THIS PATIENT'S OPERATIVE REPORT WAS DICTATED ON THE DATE OF THE PROCEDURE HOWEVER IT WAS LABELED AS A PROGRESS NOTE. I HAVE REALLY LABELED IT AS AN OP NOTE.  NAMEDione Shepherd: [id number] DOB: [birth year]DATE OF OPERATION: 12/02/2014  PREOP DIAGNOSIS: Asymptomatic greater than 80% right carotid stenosis  POSTOP DIAGNOSIS: same  PROCEDURE: Exploration of right carotid artery   SURGEON: Di Kindle. Edilia Bo, MD, FACS  ASSIST: Karsten Ro, [facility], [facility]  ANESTHESIA: Gen.   EBL: minimal  INDICATIONS: Barry Shepherd is a 69 y.o. male who was being followed with carotid disease. The right carotid stenosis progressed to greater than 80% and right carotid endarterectomy was recommended in order to lower his risk of future stroke.  FINDINGS: The plaque was not surgically accessible. I was able to get above the most significant area of the plaque, however even above this the artery was thickened. The bifurcation was somewhat high in the internal carotid artery and the ICA was rotated posteriorly making exposure distally very difficult. The patient had a good Doppler signal distal to the plaque and I felt that proceeding with endarterectomy could potentially result in incomplete endarterectomy with poor visualization and high-risk for stroke.  TECHNIQUE: The patient was taken to the operative room after an arterial line was placed by anesthesia. Patient received a general anesthetic. After careful positioning, the right neck was prepped and draped in the usual sterile fashion. An incision was made along the anterior border of the sternocleidomastoid and the dissection carried down to the common carotid artery which was dissected free and controlled with a Rummel tourniquet. The common carotid artery was slightly thickened. The superior thyroid artery and external carotid arteries were controlled. The patient was heparinized  with 9000 units of IV heparin. Bifurcation was high and I had to fully mobilize the hypoglossal nerve in order to expose the distal internal carotid artery. The artery was rotated posteriorly making exposure difficult. I was ultimately able to control the internal carotid artery above the bulk of the plaque, however even at this level the artery was thickened. As noted above was concerned about not being able to complete the endarterectomy, not having a good endpoint, and having poor visualization distally. As he had an excellent Doppler signal distal to the plaque I elected to stop at this point. The plan would be for CT angiogram postoperatively in order to evaluate him for potential carotid stenting. Hemostasis was obtained in the wound and heparin was partially reversed with protamine. The wound was irrigated with saline and closed with a deep layer of 3-0 Vicryl. The platysma was closed with running 3-0 Vicryl. The skin was closed with a 4-0 subcuticular stitch. Liquid bandage was applied. The patient tolerated the procedure well and awoke neurologically intact. All needle and sponge counts were correct. He was transferred to the recovery room in stable condition.  Barry Ferrari, MD, FACS [facility]  DATE OF DICTATION: 12/02/2014

## 2014-12-06 NOTE — Consult Note (Deleted)
[facility] Discharge Summary  Barry Shepherd [birth year] 69 y.o. male  [id number]  Admission Date: 12/02/2014  Discharge Date: 12/03/2014  Physician: Barry Mayo, MD  Admission Diagnosis: Right carotid artery stenosis I65.21  HPI:   This is a 69 y.o. male male patient of Dr. Scot Shepherd who returns today for follow up of known bilateral carotid disease and peripheral vascular disease.  He has known left SFA occlusion.   With respect to his carotid disease, he denies any history of stroke, TIAs, expressive or receptive aphasia, or amaurosis fugax.   He denies nonhealing ulcers. 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 He reports he had pain in the left buttocks after walking about 100-200 feet, relieved with rest; but he states that he has been diagnosed with bilateral hip arthritis and lumbar spine issues. He has no pain in his right hip/leg.  Patient has not had previous carotid artery intervention.  Patient reports New Medical or Surgical History: had a stress test February 2016 in [place], [place], pt does not recall cardiologist name, states result was fine. Also states his COPD testing was good recently.   Hospital Course:  The patient was admitted to the hospital and taken to the operating room on 12/02/2014 and underwent attempted right carotid endarterectomy.  Findings: The plaque was not surgically accessible. I was able to get above the most significant area of the plaque, however even above this the artery was thickened. The bifurcation was somewhat high in the internal carotid artery and the ICA was rotated posteriorly making exposure distally very difficult. The patient had a good Doppler signal distal to the plaque and I felt that proceeding with endarterectomy could potentially result in incomplete  endarterectomy with poor visualization and high-risk for stroke.  The patient tolerated the procedure well and was transported to the PACU in stable condition. A CTA neck was ordered for further evaluation.   By POD 1, the patient's neuro status was intact. The incision was clean and intact. The CTA was reviewed showing disease in the right ICA extending to the base of the skull. Similar findings on the left. Dr. Scot Shepherd to review CTA with Dr. Trula Shepherd the following week to see if the patient is a candidate for carotid stent.   The patient was discharged home on POD 1 in good condition. He was discharged with ASA, a statin and plavix.   Discharge Instructions:   The patient is discharged to home with extensive instructions on wound care and progressive ambulation.  They are instructed not to drive or perform any heavy lifting until returning to see the physician in his office.  Discharge Instructions    CAROTID Sugery: Call MD for difficulty swallowing or speaking; weakness in arms or legs that is a new symtom; severe headache.  If you have increased swelling in the neck and/or  are having difficulty breathing, CALL [phone]    Complete by:  As directed      Call MD for:  redness, tenderness, or signs of infection (pain, swelling, bleeding, redness, odor or green/yellow discharge around incision site)    Complete by:  As directed      Call MD for:  temperature >100.5    Complete by:  As directed      Discharge wound care:    Complete by:  As directed   Wash  wound daily with soap and water and pat dry.     Driving Restrictions    Complete by:  As directed   No driving for 1 week     Lifting restrictions    Complete by:  As directed   No lifting for 2 weeks     Resume previous diet    Complete by:  As directed            Discharge Diagnosis:  Right carotid artery stenosis I65.21  Secondary Diagnosis: Patient Active Problem List   Diagnosis Date Noted  . Carotid stenosis, asymptomatic  12/02/2014  . Carotid stenosis 01/27/2014  . Occlusion and stenosis of carotid artery without mention of cerebral infarction 01/09/2012  . Peripheral vascular disease, unspecified 01/09/2012   Past Medical History  Diagnosis Date  . Diabetes mellitus without complication   . Carotid artery occlusion   . Hypertension   . Hyperlipidemia       Medication List    TAKE these medications        ACCU-CHEK ACTIVE GLUCOSE CONT VI  CHECK GLUCOSE DAILY     ACCU-CHEK AVIVA PLUS test strip  Generic drug:  glucose blood     ACCU-CHEK SOFTCLIX LANCETS lancets     amLODipine 5 MG tablet  Commonly known as:  NORVASC  Take 5 mg by mouth daily.     aspirin 81 MG tablet  Take 81 mg by mouth 3 (three) times a week.     clopidogrel 75 MG tablet  Commonly known as:  PLAVIX  Take 1 tablet (75 mg total) by mouth daily.     FIFTY50 GLUCOSE METER 2.0 W/DEVICE Kit  1 each by Other route once. Use as instructed     Fish Oil 1000 MG Caps  Take 2 capsules by mouth daily.     glipiZIDE 2.5 MG 24 hr tablet  Commonly known as:  GLUCOTROL XL  Take 2.5 mg by mouth 2 (two) times daily.     LEVEMIR FLEXTOUCH 100 UNIT/ML Pen  Generic drug:  Insulin Detemir  Inject 10 Units into the skin at bedtime.     meloxicam 7.5 MG tablet  Commonly known as:  MOBIC  Take 7.5 mg by mouth daily.     metFORMIN 1000 MG tablet  Commonly known as:  GLUCOPHAGE  Take 1,000 mg by mouth 2 (two) times daily with a meal.     multivitamin with minerals tablet  Take 1 tablet by mouth 2 (two) times daily.     oxyCODONE-acetaminophen 5-325 MG per tablet  Commonly known as:  PERCOCET/ROXICET  Take 1-2 tablets by mouth every 6 (six) hours as needed for moderate pain.     simvastatin 10 MG tablet  Commonly known as:  ZOCOR  Take 1 tablet (10 mg total) by mouth daily at 6 PM.     VICTOZA 18 MG/3ML Sopn  Generic drug:  Liraglutide  Inject 5 mg into the muscle every morning.        Percocet #20 No  Refill  Disposition: Home  Patient's condition: is Good  Follow up: 1. Dr.  Scot Shepherd in 2 weeks.   Barry Jock, PA-C [facility] [phone]  --- For [facility] use --- Instructions: Press F2 to tab through selections.  Delete question if not applicable.   Modified Rankin score at D/C (0-6): 0  IV medication needed for:  1. Hypertension: Yes 2. Hypotension: Yes  Post-op Complications: No  1. Post-op CVA or TIA: No  2. CN injury: No  3. Myocardial infarction: No  4.  CHF: No  5.  Dysrhythmia (new): No  6. Wound infection: No  7. Reperfusion symptoms: No  8. Return to OR: No  Discharge medications: Statin use:  Yes If No: _0  For Medical reasons, _1  Non-compliant, _2  Not-indicated ASA use:  Yes  If No: _3  For Medical reasons, _4  Non-compliant, _5  Not-indicated Beta blocker use:  No If No: _6  For Medical reasons, _7  Non-compliant, [x ] Not-indicated ACE-Inhibitor use:  No If No: _8  For Medical reasons, _9  Non-compliant, [x ] Not-indicated P2Y12 Antagonist use: Yes, [x ] Plavix, _10  Plasugrel, _11  Ticlopinine, _12  Ticagrelor, _13  Other, _14  No for medical reason, _15  Non-compliant, _16  Not-indicated Anti-coagulant use:  No, _17  Warfarin, _18  Rivaroxaban, _19  Dabigatran, _20  Other, _21  No for medical reason, _22  Non-compliant, [x ] Not-indicated

## 2014-12-08 ENCOUNTER — Other Ambulatory Visit: Payer: Self-pay

## 2014-12-13 MED ORDER — SODIUM CHLORIDE 0.9 % IV SOLN: INTRAVENOUS | Status: DC

## 2014-12-14 ENCOUNTER — Encounter (HOSPITAL_COMMUNITY): Admission: RE | Payer: Self-pay | Source: Ambulatory Visit

## 2014-12-14 ENCOUNTER — Ambulatory Visit (HOSPITAL_COMMUNITY): Admission: RE | Admit: 2014-12-14 | Payer: Medicare PPO | Source: Ambulatory Visit | Admitting: Surgery

## 2014-12-14 SURGERY — CAROTID PTA/STENT INTERVENTION

## 2015-01-12 ENCOUNTER — Ambulatory Visit (INDEPENDENT_AMBULATORY_CARE_PROVIDER_SITE_OTHER): Payer: Medicare PPO | Admitting: Family

## 2015-01-12 ENCOUNTER — Encounter: Payer: Self-pay | Admitting: Family

## 2015-01-12 VITALS — BP 186/80 | HR 65 | Ht 64.0 in | Wt 202.0 lb

## 2015-01-12 DIAGNOSIS — Z87891 Personal history of nicotine dependence: Secondary | ICD-10-CM

## 2015-01-12 DIAGNOSIS — T8189XA Other complications of procedures, not elsewhere classified, initial encounter: Secondary | ICD-10-CM

## 2015-01-12 DIAGNOSIS — I6523 Occlusion and stenosis of bilateral carotid arteries: Secondary | ICD-10-CM

## 2015-01-12 DIAGNOSIS — T814XXA Infection following a procedure, initial encounter: Principal | ICD-10-CM

## 2015-01-12 DIAGNOSIS — E1151 Type 2 diabetes mellitus with diabetic peripheral angiopathy without gangrene: Secondary | ICD-10-CM

## 2015-01-12 DIAGNOSIS — IMO0001 Reserved for inherently not codable concepts without codable children: Secondary | ICD-10-CM

## 2015-01-12 NOTE — Progress Notes (Signed)
Postoperative Visit   History of Present Illness  Barry Shepherd is a 69 y.o. male patient of [name] who returns today for right neck incision check after attempted right CEA on 12/02/14, unable to complete as the plaque was not surgically accessible.  Pt was started on Keflex 500 mg qid by his PCP yesterday.  Pt noticed redness at right neck incision about 4 days ago. It feels stinging, pt states incision is draining blood tinged watery drainage, at times a great deal of drainage. Pt denies any fever or chills. He had a stenting of his right ICA on 01/03/15 at [facility] in [place] after obtaining a second opinion.  He has known left SFA occlusion.   With respect to his carotid disease, he denies any history of stroke, TIAs, expressive or receptive aphasia, or amaurosis fugax.   He denies nonhealing ulcers. 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 He reports he had pain in the left buttocks after walking about 100-200 feet, relieved with rest; but he states that he has been diagnosed with bilateral hip arthritis and lumbar spine issues. He has no pain in his right hip/leg.  Patient reports New Medical or Surgical History: had a stress test February 2016 in [place], [place], pt does not recall cardiologist name, states result was fine. Also states his COPD testing was good recently.  Pt Diabetic: Yes, self reported home glucose 117-200, sounds almost in control Pt smoker: former smoker (quit about early October 2016, started at age 63 yrs)  Pt meds include: Statin : No ASA: Yes Other anticoagulants/antiplatelets: no   For [facility] Use Only  PRE-ADM LIVING: Home  AMB STATUS: Ambulatory  Social History   Social History  . Marital Status: Married    Spouse Name: N/A  . Number of Children: N/A  . Years of Education: N/A     Occupational History  . Not on file.   Social History Main Topics  . Smoking status: Current Every Day Smoker -- 1.50 packs/day for 50 years    Types: Cigarettes  . Smokeless tobacco: Never Used  . Alcohol Use: No  . Drug Use: No  . Sexual Activity: Not on file   Other Topics Concern  . Not on file   Social History Narrative    Current Outpatient Prescriptions on File Prior to Visit  Medication Sig Dispense Refill  . ACCU-CHEK AVIVA PLUS test strip     . ACCU-CHEK SOFTCLIX LANCETS lancets     . amLODipine (NORVASC) 5 MG tablet Take 5 mg by mouth daily.    Marland Kitchen aspirin 81 MG tablet Take 81 mg by mouth 3 (three) times a week.     . Blood Glucose Calibration (ACCU-CHEK ACTIVE GLUCOSE CONT VI) CHECK GLUCOSE DAILY    . Blood Glucose Monitoring Suppl (FIFTY50 GLUCOSE METER 2.0) W/DEVICE KIT 1 each by Other route once. Use as instructed    . clopidogrel (PLAVIX) 75 MG tablet Take 1 tablet (75 mg total) by mouth daily. 30 tablet 11  . glipiZIDE (GLUCOTROL XL) 2.5 MG 24 hr tablet Take 2.5 mg by mouth 2 (two) times daily.     Marland Kitchen LEVEMIR FLEXTOUCH 100 UNIT/ML Pen Inject 10 Units into the skin at bedtime.     . meloxicam (MOBIC) 7.5 MG tablet Take 7.5 mg by mouth daily.     Marland Kitchen  metFORMIN (GLUCOPHAGE) 1000 MG tablet Take 1,000 mg by mouth 2 (two) times daily with a meal.    . Multiple Vitamins-Minerals (MULTIVITAMIN WITH MINERALS) tablet Take 1 tablet by mouth 2 (two) times daily.    . Omega-3 Fatty Acids (FISH OIL) 1000 MG CAPS Take 2 capsules by mouth daily.     Marland Kitchen oxyCODONE-acetaminophen (PERCOCET/ROXICET) 5-325 MG per tablet Take 1-2 tablets by mouth every 6 (six) hours as needed for moderate pain. 20 tablet 0  . simvastatin (ZOCOR) 10 MG tablet Take 1 tablet (10 mg total) by mouth daily at 6 PM. 30 tablet 11  . VICTOZA 18 MG/3ML SOPN Inject 5 mg into the muscle every morning.      No current facility-administered medications on file prior to visit.    Physical Examination  Filed Vitals:    01/12/15 0906 01/12/15 0908 01/12/15 0910  BP: 205/81 198/86 186/80  Pulse: 65 65 65  Height: 5' 4"  (1.626 m)    Weight: 202 lb (91.627 kg)     Body mass index is 34.66 kg/(m^2).   General: WDWN obese male in NAD GAIT: normal Eyes: Pupils are equal Pulmonary: Non-labored Cardiac: regular Rhythm  Radial pulses are 2+ palpable and equal.     Musculoskeletal: Negative muscle atrophy/wasting. M/S 5/5 throughout, Extremities without ischemic changes  Neurologic: A&O X 3; Appropriate Affect, Speech is normal CN 2-12 intact, Pain and light touch intact in extremities, Motor exam as listed above.                     Medical Decision Making  Barry Shepherd is a 69 y.o. male who presents s/p attempted right CEA on 12/02/14, unable to complete as the plaque was not surgically accessible. He had a stenting of his right ICA on 01/03/15 at [facility] in [place] after obtaining a second opinion. He quit smoking about this time. His right side neck incision has moderate swelling, erythema, and scant purulent drainage from the right side neck incision when 2 areas of the incision were expressed. He seems to have an inflammatory reaction to the vicryl sutures with ensuing suture abscess drainage, or may have an infection of the incision.  Continue the Keflex. Wife instructed in packing the small opening of right neck incision made by Dr. Oneida Alar, with sterile tape gauze and sterile cotton swab opposite tip, applying folded 4x4 gauze, affixed with paper tape. Pt and wife instructed that pt should change the dressing twice daily after cleansing with soap and water. Wife was given dressing supplies.   He states his blood sugars have been high in the last month and he is working with his PCP to get his blood sugar under better control. Return in 2  weeks for [name] to evaluate right neck incision. Pt advised to notify us if his incision is not improving or seems to be worsening.      Ardit Danh, Sharmon Leyden, RN, MSN, FNP-C [facility] [place] Office: [phone]  01/12/2015, 9:03 AM  Clinic MD: Oneida Alar

## 2015-01-12 NOTE — Patient Instructions (Signed)
Carotid Endarterectomy, Care After  Refer to this sheet in the next few weeks. These instructions provide you with information on caring for yourself after your procedure. Your health care provider may also give you more specific instructions. Your treatment has been planned according to current medical practices, but problems sometimes occur. Call your health care provider if you have any problems or questions after your procedure.   WHAT TO EXPECT AFTER THE PROCEDURE  You may have some pain or an ache in your neck for up to 2 weeks. This is normal. Recovery time varies depending on your age, condition, general health, and other factors. You will likely be able to return to a normal lifestyle within a few weeks.   HOME CARE INSTRUCTIONS   · Take showers if your health care provider approves. Do not take baths, swim, or use a hot tub until your health care provider approves.    · Take medicines only as directed by your health care provider. If a blood thinner (anticoagulant) is prescribed after surgery, take this medicine exactly as directed.  · Change bandages (dressings) as directed by your health care provider.    · Avoid heavy lifting or strenuous activity until your health care provider says it is okay. Resume your normal activities as directed.    · Stop smoking if you smoke. This is a risk factor for poor wound healing.    · Stop taking the pill (oral contraceptives) unless your health care provider recommends otherwise.    · Maintain good control of your blood pressure.    · Exercise regularly or as instructed by your health care provider.    · Eat a heart-healthy diet. Talk to your health care provider about how to lower blood lipids (cholesterol and triglycerides).    · Keep all follow-up visits as directed by your health care provider. Make an appointment for the removal of stitches (sutures) or staples.  SEEK MEDICAL CARE IF:   · You have increased bleeding from the incision site.    · You notice  redness, swelling, or increasing pain at the incision site.    · You notice swelling in your neck or have difficulty breathing or talking.    · You notice a bad smell or pus coming from the incision site or dressing.    · You have a fever.   · You develop a rash.    · You develop any reaction or side effects to medicine given.    SEEK IMMEDIATE MEDICAL CARE IF:   · Your initial symptoms are getting worse rather than better.    · You develop any abnormal bruising or bleeding.    · You have difficulty breathing.    · You develop chest pain, shortness of breath, or pain or swelling in your legs.    · You have a return of symptoms or problems that caused you to have this surgery.    · You develop a temporary loss of vision.    · You develop temporary numbness on one side.    · You develop a temporary inability to speak (aphasia).    · You develop temporary weakness.    MAKE SURE YOU:   · Understand these instructions.  · Will watch your condition.  · Will get help right away if you are not doing well or get worse.     This information is not intended to replace advice given to you by your health care provider. Make sure you discuss any questions you have with your health care provider.     Document Released: 09/29/2004 Document Revised: 04/02/2014 Document Reviewed: 08/13/2012    Elsevier Interactive Patient Education ©2016 [employer]

## 2015-01-26 ENCOUNTER — Ambulatory Visit: Payer: Medicare PPO | Admitting: Vascular Surgery

## 2016-05-16 IMAGING — CT CT ANGIO NECK
1 of 10 series · 1 of 33 positions shown · IV contrast (Iohexol (Omnipaque 350))
Comparison: None.

CLINICAL DATA: Right carotid exploration today. Carotid
endarterectomy not performed.

EXAM:
CT ANGIOGRAPHY NECK
TECHNIQUE: Multidetector CT imaging of the neck was performed using the
standard protocol during bolus administration of intravenous
contrast. Multiplanar CT image reconstructions and MIPs were
obtained to evaluate the vascular anatomy. Carotid stenosis
measurements (when applicable) are obtained utilizing NASCET
criteria, using the distal internal carotid diameter as the
denominator.
CONTRAST:  80mL OMNIPAQUE IOHEXOL 350 MG/ML SOLN

[Series 200: locator · axial · 0.45mm/px · 1 of 1 slices shown]
[im 1/1  soft-tissue]
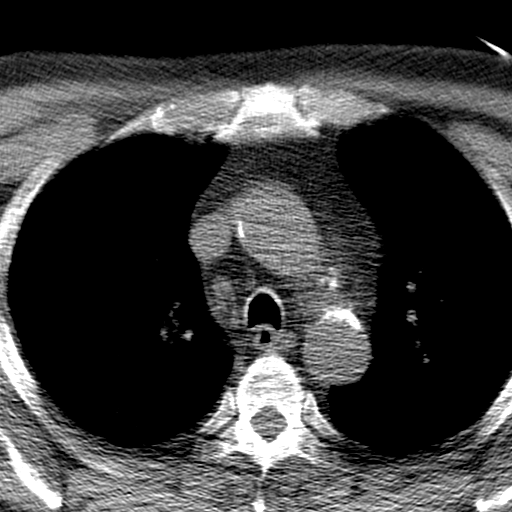

[1 of 33 positions shown; findings below may reference images not displayed]

FINDINGS: Aortic arch: Atherosclerotic calcification in the aortic arch
without aneurysm or aortic dissection. Proximal great vessels have
mild atherosclerotic disease and are widely patent. Lung apices are
clear.

Right carotid system: Postop right carotid exploration today. Gas
and edema is present in the soft tissues. No focal hematoma.

Noncalcified atherosclerotic plaque throughout the right common
carotid artery without significant stenosis. Calcified and
noncalcified plaque in the right carotid bifurcation and carotid
bulb. Atherosclerotic irregularity extends up to the skullbase with
numerous areas of calcified and noncalcified plaque. The most severe
stenosis is approximately 15 mm above the bifurcation. Minimal
luminal diameter 1.3 mm corresponding to 75% diameter stenosis. No
dissection.

Left carotid system: Atherosclerotic noncalcified plaque throughout
the left common carotid artery without significant stenosis. Heavily
calcified plaque in the left carotid bulb which appears be causing
severe luminal stenosis. It is difficult to measure the lumen
diameter due to calcific plaque. The lumen is estimated at 0.9mm.
Estimated 82% diameter stenosis proximal left internal carotid
artery. Left external carotid artery patent. Left internal carotid
artery has diffuse atherosclerotic disease to the skullbase without
additional significant stenosis.

Vertebral arteries:Mild atherosclerotic plaque at the origin of the
right vertebral artery. Left vertebral artery patent proximally.
Diffuse atherosclerotic disease is present in the vertebral artery
bilaterally right greater than left with moderate stenosis in the
midportion and moderate stenosis distally. Mild stenosis distal left
vertebral artery. Both vertebral arteries are patent to the basilar.

Skeleton: Mild disc degeneration and spurring. Mild facet
arthropathy. No acute bony lesion identified.

Other neck: Negative for adenopathy in neck.
IMPRESSION: Postsurgical changes right neck from carotid exploration today.
Carotid endarterectomy not performed. There is extensive
atherosclerotic plaque in the common and internal carotid artery
diffusely. There is calcific stenosis at the carotid bifurcation. 15
mm above the bifurcation is a focal stenosis estimated at 75%
diameter stenosis.

Left common and left internal carotid artery diffusely diseased with
atherosclerotic plaque. There is a heavily calcified focal plaque in
the proximal left internal carotid artery narrowing the lumen by 82%
diameter stenosis. Atherosclerotic disease extends to the skullbase.

Atherosclerotic disease in the vertebral arteries bilaterally right
greater than left.

I discussed the findings in person with Dr. Aispuro.

## 2018-01-27 DIAGNOSIS — N401 Enlarged prostate with lower urinary tract symptoms: Secondary | ICD-10-CM | POA: Diagnosis not present

## 2018-01-27 DIAGNOSIS — N138 Other obstructive and reflux uropathy: Secondary | ICD-10-CM | POA: Diagnosis not present

## 2018-01-27 DIAGNOSIS — N529 Male erectile dysfunction, unspecified: Secondary | ICD-10-CM | POA: Diagnosis not present

## 2018-01-27 DIAGNOSIS — R351 Nocturia: Secondary | ICD-10-CM | POA: Diagnosis not present

## 2018-02-04 DIAGNOSIS — M48062 Spinal stenosis, lumbar region with neurogenic claudication: Secondary | ICD-10-CM | POA: Diagnosis not present

## 2018-02-04 DIAGNOSIS — M5416 Radiculopathy, lumbar region: Secondary | ICD-10-CM | POA: Diagnosis not present

## 2018-02-04 DIAGNOSIS — M5442 Lumbago with sciatica, left side: Secondary | ICD-10-CM | POA: Diagnosis not present

## 2018-02-04 DIAGNOSIS — M5412 Radiculopathy, cervical region: Secondary | ICD-10-CM | POA: Diagnosis not present

## 2018-02-04 DIAGNOSIS — G8929 Other chronic pain: Secondary | ICD-10-CM | POA: Diagnosis not present

## 2018-02-10 DIAGNOSIS — E782 Mixed hyperlipidemia: Secondary | ICD-10-CM | POA: Diagnosis not present

## 2018-02-10 DIAGNOSIS — E1122 Type 2 diabetes mellitus with diabetic chronic kidney disease: Secondary | ICD-10-CM | POA: Diagnosis not present

## 2018-02-10 DIAGNOSIS — I130 Hypertensive heart and chronic kidney disease with heart failure and stage 1 through stage 4 chronic kidney disease, or unspecified chronic kidney disease: Secondary | ICD-10-CM | POA: Diagnosis not present

## 2018-02-10 DIAGNOSIS — E1129 Type 2 diabetes mellitus with other diabetic kidney complication: Secondary | ICD-10-CM | POA: Diagnosis not present

## 2018-02-10 DIAGNOSIS — I471 Supraventricular tachycardia: Secondary | ICD-10-CM | POA: Diagnosis not present

## 2018-02-10 DIAGNOSIS — K219 Gastro-esophageal reflux disease without esophagitis: Secondary | ICD-10-CM | POA: Diagnosis not present

## 2018-02-10 DIAGNOSIS — Z6834 Body mass index (BMI) 34.0-34.9, adult: Secondary | ICD-10-CM | POA: Diagnosis not present

## 2018-02-26 DIAGNOSIS — Z9889 Other specified postprocedural states: Secondary | ICD-10-CM | POA: Diagnosis not present

## 2018-02-26 DIAGNOSIS — I6523 Occlusion and stenosis of bilateral carotid arteries: Secondary | ICD-10-CM | POA: Diagnosis not present

## 2018-02-26 DIAGNOSIS — Z48812 Encounter for surgical aftercare following surgery on the circulatory system: Secondary | ICD-10-CM | POA: Diagnosis not present

## 2018-04-29 DIAGNOSIS — E1122 Type 2 diabetes mellitus with diabetic chronic kidney disease: Secondary | ICD-10-CM | POA: Diagnosis not present

## 2018-04-29 DIAGNOSIS — E1140 Type 2 diabetes mellitus with diabetic neuropathy, unspecified: Secondary | ICD-10-CM | POA: Diagnosis not present

## 2018-05-24 DIAGNOSIS — S0990XA Unspecified injury of head, initial encounter: Secondary | ICD-10-CM | POA: Diagnosis not present

## 2018-05-24 DIAGNOSIS — R079 Chest pain, unspecified: Secondary | ICD-10-CM | POA: Diagnosis not present

## 2018-05-24 DIAGNOSIS — R51 Headache: Secondary | ICD-10-CM | POA: Diagnosis not present

## 2018-05-24 DIAGNOSIS — M542 Cervicalgia: Secondary | ICD-10-CM | POA: Diagnosis not present

## 2018-05-24 DIAGNOSIS — M546 Pain in thoracic spine: Secondary | ICD-10-CM | POA: Diagnosis not present

## 2018-05-24 DIAGNOSIS — R0789 Other chest pain: Secondary | ICD-10-CM | POA: Diagnosis not present

## 2018-05-24 DIAGNOSIS — S299XXA Unspecified injury of thorax, initial encounter: Secondary | ICD-10-CM | POA: Diagnosis not present

## 2018-05-24 DIAGNOSIS — I10 Essential (primary) hypertension: Secondary | ICD-10-CM | POA: Diagnosis not present

## 2018-05-24 DIAGNOSIS — S199XXA Unspecified injury of neck, initial encounter: Secondary | ICD-10-CM | POA: Diagnosis not present

## 2018-05-24 DIAGNOSIS — S3991XA Unspecified injury of abdomen, initial encounter: Secondary | ICD-10-CM | POA: Diagnosis not present

## 2018-05-24 DIAGNOSIS — E1165 Type 2 diabetes mellitus with hyperglycemia: Secondary | ICD-10-CM | POA: Diagnosis not present

## 2018-06-25 DEATH — deceased

## 2020-10-24 DEATH — deceased
